# Patient Record
Sex: Male | Born: 1995
Health system: Southern US, Community
[De-identification: ages and names within clinical notes are randomized; demographics above are authoritative.]

## PROBLEM LIST (undated history)

## (undated) DIAGNOSIS — Z789 Other specified health status: Secondary | ICD-10-CM

## (undated) DIAGNOSIS — F909 Attention-deficit hyperactivity disorder, unspecified type: Secondary | ICD-10-CM

## (undated) HISTORY — DX: Attention-deficit hyperactivity disorder, unspecified type: F90.9

## (undated) HISTORY — PX: NO PAST SURGERIES: SHX2092

---
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
## 2015-03-13 ENCOUNTER — Encounter: Payer: Self-pay | Admitting: Emergency Medicine

## 2015-03-13 ENCOUNTER — Ambulatory Visit
Admission: EM | Admit: 2015-03-13 | Discharge: 2015-03-13 | Disposition: A | Discharge status: Home / Self Care | Payer: 59 | Attending: Family Medicine | Admitting: Family Medicine

## 2015-03-13 DIAGNOSIS — J101 Influenza due to other identified influenza virus with other respiratory manifestations: Secondary | ICD-10-CM | POA: Diagnosis not present

## 2015-03-13 LAB — RAPID INFLUENZA A&B ANTIGENS
Influenza A (ARMC): DETECTED — AB
Influenza B (ARMC): NOT DETECTED — AB

## 2015-03-13 MED ORDER — OSELTAMIVIR PHOSPHATE 75 MG PO CAPS: 75.0000 mg | ORAL_CAPSULE | Freq: Two times a day (BID) | ORAL | Status: DC

## 2015-03-13 MED ORDER — IBUPROFEN 800 MG PO TABS
800.0000 mg | ORAL_TABLET | Freq: Once | ORAL | Status: AC
  Administered 2015-03-13: 800 mg via ORAL

## 2015-03-13 NOTE — ED Notes (Signed)
Pt reports fever since Sat, flu like symptoms.

## 2015-03-13 NOTE — Discharge Instructions (Signed)
Take medication as prescribed. Rest. Drink plenty of fluids. Take over the counter tylenol or ibuprofen as needed for pain or fever.   Follow up with your primary care physician this week as needed. Return to Urgent care for new or worsening concerns.    Influenza, Adult Influenza ("the flu") is a viral infection of the respiratory tract. It occurs more often in winter months because people spend more time in close contact with one another. Influenza can make you feel very sick. Influenza easily spreads from person to person (contagious). CAUSES  Influenza is caused by a virus that infects the respiratory tract. You can catch the virus by breathing in droplets from an infected person's cough or sneeze. You can also catch the virus by touching something that was recently contaminated with the virus and then touching your mouth, nose, or eyes. RISKS AND COMPLICATIONS You may be at risk for a more severe case of influenza if you smoke cigarettes, have diabetes, have chronic heart disease (such as heart failure) or lung disease (such as asthma), or if you have a weakened immune system. Elderly people and pregnant women are also at risk for more serious infections. The most common problem of influenza is a lung infection (pneumonia). Sometimes, this problem can require emergency medical care and may be life threatening. SIGNS AND SYMPTOMS  Symptoms typically last 4 to 10 days and may include:  Fever.  Chills.  Headache, body aches, and muscle aches.  Sore throat.  Chest discomfort and cough.  Poor appetite.  Weakness or feeling tired.  Dizziness.  Nausea or vomiting. DIAGNOSIS  Diagnosis of influenza is often made based on your history and a physical exam. A nose or throat swab test can be done to confirm the diagnosis. TREATMENT  In mild cases, influenza goes away on its own. Treatment is directed at relieving symptoms. For more severe cases, your health care provider may prescribe  antiviral medicines to shorten the sickness. Antibiotic medicines are not effective because the infection is caused by a virus, not by bacteria. HOME CARE INSTRUCTIONS  Take medicines only as directed by your health care provider.  Use a cool mist humidifier to make breathing easier.  Get plenty of rest until your temperature returns to normal. This usually takes 3 to 4 days.  Drink enough fluid to keep your urine clear or pale yellow.  Cover yourmouth and nosewhen coughing or sneezing,and wash your handswellto prevent thevirusfrom spreading.  Stay homefromwork orschool untilthe fever is gonefor at least 80full day. PREVENTION  An annual influenza vaccination (flu shot) is the best way to avoid getting influenza. An annual flu shot is now routinely recommended for all adults in the [place] SEEK MEDICAL CARE IF:  You experiencechest pain, yourcough worsens,or you producemore mucus.  Youhave nausea,vomiting, ordiarrhea.  Your fever returns or gets worse. SEEK IMMEDIATE MEDICAL CARE IF:  You havetrouble breathing, you become short of breath,or your skin ornails becomebluish.  You have severe painor stiffnessin the neck.  You develop a sudden headache, or pain in the face or ear.  You have nausea or vomiting that you cannot control. MAKE SURE YOU:   Understand these instructions.  Will watch your condition.  Will get help right away if you are not doing well or get worse.   This information is not intended to replace advice given to you by your health care provider. Make sure you discuss any questions you have with your health care provider.   Document Released: 12/28/1999 Document  Revised: 01/20/2014 Document Reviewed: 03/31/2011 Elsevier Interactive Patient Education [employer]

## 2015-03-13 NOTE — ED Provider Notes (Signed)
[facility]  ____________________________________________  Time seen: Approximately 11:48 AM  I have reviewed the triage vital signs and the nursing notes.   HISTORY  Chief Complaint Fever   HPI Adam Cantu is a 20 y.o. male presents for complaints of runny nose, nasal congestion, cough, feeling like he has a fever, body aches x 2 days. Reports continues to drink fluids well but with slight decrease in appetite. States generally achy all over. States has taken over-the-counter Tylenol and ibuprofen. Last took Tylenol early this morning. Denies home sick contacts. Reports some sick contacts at work but unsure of what they had. Reports did not receive flu immunization.  Denies chest pain, shortness breath, abdominal pain, dysuria, neck or back pain, rash or weakness.   History reviewed. No pertinent past medical history.  There are no active problems to display for this patient.   History reviewed. No pertinent past surgical history.  Current Outpatient Rx  Name  Route  Sig  Dispense  Refill  .             Allergies Review of patient's allergies indicates no known allergies.  History reviewed. No pertinent family history.  Social History Social History  Substance Use Topics  . Smoking status: Current Some Day Smoker  . Smokeless tobacco: None  . Alcohol Use: No    Review of Systems Constitutional: positive warm/chills Eyes: No visual changes. ENT: No sore throat. Positive runny nose, nasal congestion, cough. Cardiovascular: Denies chest pain. Respiratory: Denies shortness of breath. Gastrointestinal: No abdominal pain.  No nausea, no vomiting.  No diarrhea.  No constipation. Genitourinary: Negative for dysuria. Musculoskeletal: Negative for back pain. Skin: Negative for rash. Neurological: Negative for headaches, focal weakness or numbness.  10-point ROS otherwise negative. ____________________________________________   PHYSICAL EXAM:  VITAL  SIGNS: ED Triage Vitals  Enc Vitals Group     BP 03/13/15 1019 129/78 mmHg     Pulse Rate 03/13/15 1019 90     Resp 03/13/15 1019 18     Temp 03/13/15 1019 100 F (37.8 C)     Temp Source 03/13/15 1019 Oral     SpO2 03/13/15 1019 100 %     Weight 03/13/15 1019 234 lb (106.142 kg)     Height 03/13/15 1019  (1.854 m)     Head Cir --      Peak Flow --      Pain Score 03/13/15 1022 7     Pain Loc --      Pain Edu? --      Excl. in GC? --   Constitutional: Alert and oriented. Well appearing and in no acute distress. Eyes: Conjunctivae are normal. PERRL. EOMI. Head: Atraumatic. No sinus tenderness to palpation. No swelling. No erythema.  Ears: no erythema, normal TMs bilaterally.   Nose:Nasal congestion with clear rhinorrhea  Mouth/Throat: Mucous membranes are moist. No pharyngeal erythema. No tonsillar swelling or exudate.  Neck: No stridor.  No cervical spine tenderness to palpation. Hematological/Lymphatic/Immunilogical: No cervical lymphadenopathy. Cardiovascular: Normal rate, regular rhythm. Grossly normal heart sounds.  Good peripheral circulation. Respiratory: Normal respiratory effort.  No retractions. Lungs CTAB.No wheezes, rales or rhonchi. Good air movement.  Gastrointestinal: Soft and nontender. Normal Bowel sounds. No CVA tenderness. Musculoskeletal: No lower or upper extremity tenderness nor edema. No cervical, thoracic or lumbar tenderness to palpation. Neurologic:  Normal speech and language. No gross focal neurologic deficits are appreciated. No gait instability. Skin:  Skin is warm, dry and intact. No rash  noted. Psychiatric: Mood and affect are normal. Speech and behavior are normal.  ____________________________________________   LABS (all labs ordered are listed, but only abnormal results are displayed)  Labs Reviewed  RAPID INFLUENZA A&B ANTIGENS ([facility] ONLY) - Abnormal; Notable for the following:    Influenza A ([facility]) DETECTED (*)    Influenza B ([facility])  NOT DETECTED (*)    All other components within normal limits     INITIAL IMPRESSION / ASSESSMENT AND PLAN / ED COURSE  Pertinent labs & imaging results that were available during my care of the patient were reviewed by me and considered in my medical decision making (see chart for details).  Very well-appearing patient. No acute distress. Presents with complaints of 2 days of runny nose, nasal congestion, cough, body aches and subjective report of fevers. Reports has taken over-the-counter cough and congestion medicines as well as Tylenol and ibuprofen. Lungs clear throughout. Abdomen soft and nontender. Very well-appearing patient. Moist mucous membranes. Suspect viral upper respiratory infection such as influenza.ibuprofen 800 mg orally x one in urgent care.   Influenza A positive. Discussed with patient treatment with Tamiflu. Patient requests treatment with Tamiflu. Encourage rest, fluids, over-the-counter Tylenol or ibuprofen as needed. Patient denies need for additional medication. Discussed indication, risks and benefits of medications with patient. Follow up with PCP. Work note for today and tomorrow given.  Discussed follow up with Primary care physician this week. Discussed follow up and return parameters including no resolution or any worsening concerns. Patient verbalized understanding and agreed to plan.   ____________________________________________   FINAL CLINICAL IMPRESSION(S) / ED DIAGNOSES  Final diagnoses:  Influenza A      Note: This dictation was prepared with Dragon dictation along with smaller phrase technology. Any transcriptional errors that result from this process are unintentional.    Renford Dills, NP 03/13/15 1200  Renford Dills, NP 03/13/15 [phone]

## 2015-05-10 ENCOUNTER — Ambulatory Visit
Admission: EM | Admit: 2015-05-10 | Discharge: 2015-05-10 | Disposition: A | Discharge status: Home / Self Care | Payer: 59 | Attending: Family Medicine | Admitting: Family Medicine

## 2015-05-10 ENCOUNTER — Encounter: Payer: Self-pay | Admitting: Emergency Medicine

## 2015-05-10 DIAGNOSIS — S39012A Strain of muscle, fascia and tendon of lower back, initial encounter: Secondary | ICD-10-CM | POA: Diagnosis not present

## 2015-05-10 MED ORDER — NAPROXEN 500 MG PO TABS: 500.0000 mg | ORAL_TABLET | Freq: Two times a day (BID) | ORAL | Status: DC

## 2015-05-10 MED ORDER — METAXALONE 800 MG PO TABS
800.0000 mg | ORAL_TABLET | Freq: Three times a day (TID) | ORAL | Status: DC
Start: 2015-05-10 — End: 2016-02-19

## 2015-05-10 NOTE — Discharge Instructions (Signed)
Back Injury Prevention °Back injuries can be very painful. They can also be difficult to heal. After having one back injury, you are more likely to injure your back again. It is important to learn how to avoid injuring or re-injuring your back. The following tips can help you to prevent a back injury. °WHAT SHOULD I KNOW ABOUT PHYSICAL FITNESS? °· Exercise for 30 minutes per day on most days of the week or as directed by your health care provider. Make sure to: °· Do aerobic exercises, such as walking, jogging, biking, or swimming. °· Do exercises that increase balance and strength, such as tai chi and yoga. These can decrease your risk of falling and injuring your back. °· Do stretching exercises to help with flexibility. °· Try to develop strong abdominal muscles. Your abdominal muscles provide a lot of the support that is needed by your back. °· Maintain a healthy weight.  This helps to decrease your risk of a back injury. °WHAT SHOULD I KNOW ABOUT MY DIET? °· Talk with your health care provider about your overall diet. Take supplements and vitamins only as directed by your health care provider. °· Talk with your health care provider about how much calcium and vitamin D you need each day. These nutrients help to prevent weakening of the bones (osteoporosis). Osteoporosis can cause broken (fractured) bones, which lead to back pain. °· Include good sources of calcium in your diet, such as dairy products, green leafy vegetables, and products that have had calcium added to them (fortified). °· Include good sources of vitamin D in your diet, such as milk and foods that are fortified with vitamin D. °WHAT SHOULD I KNOW ABOUT MY POSTURE? °· Sit up straight and stand up straight. Avoid leaning forward when you sit or hunching over when you stand. °· Choose chairs that have good low-back (lumbar) support. °· If you work at a desk, sit close to it so you do not need to lean over. Keep your chin tucked in. Keep your neck  drawn back, and keep your elbows bent at a right angle. Your arms should look like the letter "L." °· Sit high and close to the steering wheel when you drive. Add a lumbar support to your car seat, if needed. °· Avoid sitting or standing in one position for very long. Take breaks to get up, stretch, and walk around at least one time every hour. Take breaks every hour if you are driving for long periods of time. °· Sleep on your side with your knees slightly bent, or sleep on your back with a pillow under your knees. Do not lie on the front of your body to sleep. °WHAT SHOULD I KNOW ABOUT LIFTING, TWISTING, AND REACHING? °Lifting and Heavy Lifting °· Avoid heavy lifting, especially repetitive heavy lifting. If you must do heavy lifting: °· Stretch before lifting. °· Work slowly. °· Rest between lifts. °· Use a tool such as a cart or a dolly to move objects if one is available. °· Make several small trips instead of carrying one heavy load. °· Ask for help when you need it, especially when moving big objects. °· Follow these steps when lifting: °· Stand with your feet shoulder-width apart. °· Get as close to the object as you can. Do not try to pick up a heavy object that is far from your body. °· Use handles or lifting straps if they are available. °· Bend at your knees. Squat down, but keep your heels off the floor. °·   Keep your shoulders pulled back, your chin tucked in, and your back straight. °· Lift the object slowly while you tighten the muscles in your legs, abdomen, and buttocks. Keep the object as close to the center of your body as possible. °· Follow these steps when putting down a heavy load: °· Stand with your feet shoulder-width apart. °· Lower the object slowly while you tighten the muscles in your legs, abdomen, and buttocks. Keep the object as close to the center of your body as possible. °· Keep your shoulders pulled back, your chin tucked in, and your back straight. °· Bend at your knees. Squat  down, but keep your heels off the floor. °· Use handles or lifting straps if they are available. °Twisting and Reaching °· Avoid lifting heavy objects above your waist. °· Do not twist at your waist while you are lifting or carrying a load. If you need to turn, move your feet. °· Do not bend over without bending at your knees. °· Avoid reaching over your head, across a table, or for an object on a high surface. °WHAT ARE SOME OTHER TIPS? °· Avoid wet floors and icy ground. Keep sidewalks clear of ice to prevent falls. °· Do not sleep on a mattress that is too soft or too hard. °· Keep items that are used frequently within easy reach. °· Put heavier objects on shelves at waist level, and put lighter objects on lower or higher shelves. °· Find ways to decrease your stress, such as exercise, massage, or relaxation techniques. Stress can build up in your muscles. Tense muscles are more vulnerable to injury. °· Talk with your health care provider if you feel anxious or depressed. These conditions can make back pain worse. °· Wear flat heel shoes with cushioned soles. °· Avoid sudden movements. °· Use both shoulder straps when carrying a backpack. °· Do not use any tobacco products, including cigarettes, chewing tobacco, or electronic cigarettes. If you need help quitting, ask your health care provider. °  °This information is not intended to replace advice given to you by your health care provider. Make sure you discuss any questions you have with your health care provider. °  °Document Released: 02/07/2004 Document Revised: 05/16/2014 Document Reviewed: 01/03/2014 °Elsevier Interactive Patient Education ©2016 [employer] ° °Lumbosacral Strain °Lumbosacral strain is a strain of any of the parts that make up your lumbosacral vertebrae. Your lumbosacral vertebrae are the bones that make up the lower third of your backbone. Your lumbosacral vertebrae are held together by muscles and tough, fibrous tissue (ligaments).    °CAUSES  °A sudden blow to your back can cause lumbosacral strain. Also, anything that causes an excessive stretch of the muscles in the low back can cause this strain. This is typically seen when people exert themselves strenuously, fall, lift heavy objects, bend, or crouch repeatedly. °RISK FACTORS °· Physically demanding work. °· Participation in pushing or pulling sports or sports that require a sudden twist of the back (tennis, golf, baseball). °· Weight lifting. °· Excessive lower back curvature. °· Forward-tilted pelvis. °· Weak back or abdominal muscles or both. °· Tight hamstrings. °SIGNS AND SYMPTOMS  °Lumbosacral strain may cause pain in the area of your injury or pain that moves (radiates) down your leg.  °DIAGNOSIS °Your health care provider can often diagnose lumbosacral strain through a physical exam. In some cases, you may need tests such as X-ray exams.  °TREATMENT  °Treatment for your lower back injury depends on many factors that   your clinician will have to evaluate. However, most treatment will include the use of anti-inflammatory medicines. °HOME CARE INSTRUCTIONS  °· Avoid hard physical activities (tennis, racquetball, waterskiing) if you are not in proper physical condition for it. This may aggravate or create problems. °· If you have a back problem, avoid sports requiring sudden body movements. Swimming and walking are generally safer activities. °· Maintain good posture. °· Maintain a healthy weight. °· For acute conditions, you may put ice on the injured area. °¨ Put ice in a plastic bag. °¨ Place a towel between your skin and the bag. °¨ Leave the ice on for 20 minutes, 2-3 times a day. °· When the low back starts healing, stretching and strengthening exercises may be recommended. °SEEK MEDICAL CARE IF: °· Your back pain is getting worse. °· You experience severe back pain not relieved with medicines. °SEEK IMMEDIATE MEDICAL CARE IF:  °· You have numbness, tingling, weakness, or problems  with the use of your arms or legs. °· There is a change in bowel or bladder control. °· You have increasing pain in any area of the body, including your belly (abdomen). °· You notice shortness of breath, dizziness, or feel faint. °· You feel sick to your stomach (nauseous), are throwing up (vomiting), or become sweaty. °· You notice discoloration of your toes or legs, or your feet get very cold. °MAKE SURE YOU:  °· Understand these instructions. °· Will watch your condition. °· Will get help right away if you are not doing well or get worse. °  °This information is not intended to replace advice given to you by your health care provider. Make sure you discuss any questions you have with your health care provider. °  °Document Released: 10/09/2004 Document Revised: 01/20/2014 Document Reviewed: 08/18/2012 °Elsevier Interactive Patient Education ©2016 [employer] Inc[employer] ° °

## 2015-05-10 NOTE — ED Provider Notes (Signed)
CSN: [id number]     Arrival date & time 05/10/15  0818 History   First MD Initiated Contact with Patient 05/10/15 [phone]     Chief Complaint  Patient presents with  . Back Pain   (Consider location/radiation/quality/duration/timing/severity/associated sxs/prior Treatment) HPI  This is a 20 year old male who presents with a 2 day history of nonradiating lower back pain. Patient states that he had never hurt his back before. He and another individual were lifting the back of the a S 10 pickup truck in order to change a tire injuring his low back. He states that it will bother him from about L1 down through the sacrum. It is worse with bending. He denies any numbness in his legs or feet. He denies any incontinence.    History reviewed. No pertinent past medical history. History reviewed. No pertinent past surgical history. History reviewed. No pertinent family history. Social History  Substance Use Topics  . Smoking status: Current Some Day Smoker  . Smokeless tobacco: None  . Alcohol Use: No    Review of Systems  Constitutional: Positive for activity change. Negative for fever, chills and fatigue.  Musculoskeletal: Positive for myalgias and back pain.  All other systems reviewed and are negative.   Allergies  Review of patient's allergies indicates no known allergies.  Home Medications   Prior to Admission medications   Medication Sig Start Date End Date Taking? Authorizing Provider  metaxalone (SKELAXIN) 800 MG tablet Take 1 tablet (800 mg total) by mouth 3 (three) times daily. 05/10/15   Lutricia FeilWilliam P Burgundy Matuszak, PA-C  naproxen (NAPROSYN) 500 MG tablet Take 1 tablet (500 mg total) by mouth 2 (two) times daily with a meal. 05/10/15   Lutricia FeilWilliam P Leba Tibbitts, PA-C  oseltamivir (TAMIFLU) 75 MG capsule Take 1 capsule (75 mg total) by mouth every 12 (twelve) hours. 03/13/15   Renford DillsLindsey Miller, NP   Meds Ordered and Administered this Visit  Medications - No data to display  BP 140/85 mmHg  Pulse 76   Temp(Src) 97.4 F (36.3 C) (Tympanic)  Resp 16  Ht 6' (1.829 m)  Wt 234 lb (106.142 kg)  BMI 31.73 kg/m2  SpO2 100% No data found.   Physical Exam  Constitutional: He is oriented to person, place, and time. He appears well-developed and well-nourished. No distress.  HENT:  Head: Normocephalic and atraumatic.  Eyes: Conjunctivae are normal. Pupils are equal, round, and reactive to light.  Neck: Normal range of motion. Neck supple.  Musculoskeletal: Normal range of motion. He exhibits tenderness.  Examination of the lumbar spine shows a level pelvis in stance. Patient does have blunting of the lower segments particularly with forward flexion and lateral flexion bilaterally. These motions do cause him discomfort. He has difficulty in assuming an erect stance from forward flexion. He is able to toe and heel walk adequately. EHL anterior tibialis and peroneal muscles are strong. DTRs are 2+ over 4 and bilaterally symmetrical. Straight leg raise testing is negative at 90 in the sitting position. Sensation is intact to light touch throughout.  Neurological: He is alert and oriented to person, place, and time.  Skin: Skin is warm and dry. He is not diaphoretic.  Psychiatric: He has a normal mood and affect. His behavior is normal. Judgment and thought content normal.  Nursing note and vitals reviewed.   ED Course  Procedures (including critical care time)  Labs Review Labs Reviewed - No data to display  Imaging Review No results found.   Visual Acuity Review  Right Eye Distance:   Left Eye Distance:   Bilateral Distance:    Right Eye Near:   Left Eye Near:    Bilateral Near:         MDM   1. Lumbar strain, initial encounter    Discharge Medication List as of 05/10/2015  9:01 AM    START taking these medications   Details  metaxalone (SKELAXIN) 800 MG tablet Take 1 tablet (800 mg total) by mouth 3 (three) times daily., Starting 05/10/2015, Until Discontinued, Normal     naproxen (NAPROSYN) 500 MG tablet Take 1 tablet (500 mg total) by mouth 2 (two) times daily with a meal., Starting 05/10/2015, Until Discontinued, Normal      Plan: 1. Test/x-ray results and diagnosis reviewed with patient 2. rx as per orders; risks, benefits, potential side effects reviewed with patient 3. Recommend supportive treatment with Heat or ice as necessary. I told him that he must output symptoms in order for his back to heal properly. If he has any increase in symptoms radicular symptoms or any incontinence he needs to be seen immediately in the emergency department. I have advised him against driving or performing activities requiring judgment her concentration while taking Skelaxin. We will follow him on a when necessary basis. He states that he is currently off work at [employer] while they change the [employer] but will return to work tomorrow. 4. F/u prn if symptoms worsen or don't improve     Lutricia Feil, PA-C 05/10/15 [phone]

## 2015-05-10 NOTE — ED Notes (Signed)
Patient c/o lower back pain that started 2 days ago.

## 2015-05-12 ENCOUNTER — Ambulatory Visit
Admission: EM | Admit: 2015-05-12 | Discharge: 2015-05-12 | Disposition: A | Discharge status: Home / Self Care | Payer: 59 | Attending: Family Medicine | Admitting: Family Medicine

## 2015-05-12 DIAGNOSIS — J029 Acute pharyngitis, unspecified: Secondary | ICD-10-CM | POA: Diagnosis not present

## 2015-05-12 DIAGNOSIS — R591 Generalized enlarged lymph nodes: Secondary | ICD-10-CM

## 2015-05-12 DIAGNOSIS — B2790 Infectious mononucleosis, unspecified without complication: Secondary | ICD-10-CM | POA: Diagnosis not present

## 2015-05-12 HISTORY — DX: Other specified health status: Z78.9

## 2015-05-12 LAB — CBC WITH DIFFERENTIAL/PLATELET
BASOS ABS: 0 10*3/uL (ref 0–0.1)
Basophils Relative: 0 %
EOS ABS: 0.2 10*3/uL (ref 0–0.7)
Eosinophils Relative: 2 %
HCT: 40.6 % (ref 40.0–52.0)
HEMOGLOBIN: 13.5 g/dL (ref 13.0–18.0)
LYMPHS ABS: 6.6 10*3/uL — AB (ref 1.0–3.6)
Lymphocytes Relative: 63 %
MCH: 28.4 pg (ref 26.0–34.0)
MCHC: 33.3 g/dL (ref 32.0–36.0)
MCV: 85.2 fL (ref 80.0–100.0)
MONO ABS: 0.6 10*3/uL (ref 0.2–1.0)
MONOS PCT: 6 %
NEUTROS ABS: 3 10*3/uL (ref 1.4–6.5)
Neutrophils Relative %: 29 %
Platelets: 235 10*3/uL (ref 150–440)
RBC: 4.76 MIL/uL (ref 4.40–5.90)
RDW: 13.2 % (ref 11.5–14.5)
WBC: 10.4 10*3/uL (ref 3.8–10.6)

## 2015-05-12 LAB — RAPID STREP SCREEN (MED CTR MEBANE ONLY): STREPTOCOCCUS, GROUP A SCREEN (DIRECT): NEGATIVE

## 2015-05-12 LAB — MONONUCLEOSIS SCREEN: MONO SCREEN: POSITIVE — AB

## 2015-05-12 MED ORDER — AZITHROMYCIN 250 MG PO TABS: ORAL_TABLET | ORAL | Status: DC

## 2015-05-12 MED ORDER — LIDOCAINE VISCOUS 2 % MT SOLN: 15.0000 mL | Freq: Three times a day (TID) | OROMUCOSAL | Status: DC | PRN

## 2015-05-12 MED ORDER — DEXAMETHASONE SODIUM PHOSPHATE 10 MG/ML IJ SOLN
10.0000 mg | Freq: Once | INTRAMUSCULAR | Status: AC
  Administered 2015-05-12: 10 mg via INTRAMUSCULAR

## 2015-05-12 MED ORDER — CEFTRIAXONE SODIUM 1 G IJ SOLR
1.0000 g | Freq: Once | INTRAMUSCULAR | Status: AC
  Administered 2015-05-12: 1 g via INTRAMUSCULAR

## 2015-05-12 NOTE — ED Provider Notes (Signed)
[facility]  ____________________________________________  Time seen: Approximately 1:06 PM  I have reviewed the triage vital signs and the nursing notes.   HISTORY  Chief Complaint Lymphadenopathy   HPI Adam Cantu is a 20 y.o. male presents with a complaint of sore throat 2 weeks. Patient states that initially both sides of his throat hurt but states over the last week only the right side of his throat has hurt. Patient reports that he is able to continue to eat and drink but it hurts to swallow. Reports continues to drink fluids pretty well. Patient reports that he feels like his lymph nodes on his right neck are swollen area to denies known fevers. Denies runny nose, cough, congestion, abdominal pain, dysuria, chest pain, shortness of breath, rash, neck pain, back pain, extremity pain. Reports some sick coworkers. States coworker had strep just prior to his onset.  PCP: Loney Hering  Past Medical History  Diagnosis Date  . Patient denies medical problems     There are no active problems to display for this patient.   History reviewed. No pertinent past surgical history.  Current Outpatient Rx  Name  Route  Sig  Dispense  Refill  .           .           .             Allergies Review of patient's allergies indicates no known allergies.  History reviewed. No pertinent family history.  Social History Social History  Substance Use Topics  . Smoking status: Current Some Day Smoker -- 0.50 packs/day    Types: Cigarettes  . Smokeless tobacco: None  . Alcohol Use: No    Review of Systems Constitutional: No fever/chills Eyes: No visual changes. ENT: Positive sore throat.  Cardiovascular: Denies chest pain. Respiratory: Denies shortness of breath. Gastrointestinal: No abdominal pain.  No nausea, no vomiting.  No diarrhea.  No constipation. Genitourinary: Negative for dysuria. Musculoskeletal: Negative for back pain. Skin: Negative for rash. Neurological:  Negative for headaches, focal weakness or numbness.  10-point ROS otherwise negative.  ____________________________________________   PHYSICAL EXAM:  VITAL SIGNS: ED Triage Vitals  Enc Vitals Group     BP 05/12/15 1225 134/81 mmHg     Pulse Rate 05/12/15 1225 85     Resp 05/12/15 1225 20     Temp 05/12/15 1225 98.2 F (36.8 C)     Temp Source 05/12/15 1225 Oral     SpO2 05/12/15 1225 100 %     Weight 05/12/15 1225 234 lb (106.142 kg)     Height 05/12/15 1225 6' (1.829 m)     Head Cir --      Peak Flow --      Pain Score 05/12/15 1228 9     Pain Loc --      Pain Edu? --      Excl. in GC? --     Constitutional: Alert and oriented. Well appearing and in no acute distress. Eyes: Conjunctivae are normal. PERRL. EOMI. Head: Atraumatic. No sinus tenderness to palpation. No swelling, no erythema.   Ears: no erythema, normal TMs bilaterally.   Nose: No congestion/rhinnorhea.  Mouth/Throat: Mucous membranes are moist.  Moderate pharyngeal erythema bilaterally. Mild left tonsillar swelling, 2+ right tonsillar swelling with exudative appearance. Mild uvular shift to the right. Neck: No stridor.  No cervical spine tenderness to palpation. Hematological/Lymphatic/Immunilogical: anterior right cervical lymphadenopathy, x one right post auricular lymphadenopathy  Cardiovascular: Normal rate,  regular rhythm. Grossly normal heart sounds.  Good peripheral circulation. Respiratory: Normal respiratory effort.  No retractions. Lungs CTAB. no wheezes, rales or rhonchi. Gastrointestinal: Soft and nontender. No distention. Normal Bowel sounds.  No hepatomegaly or splenomegaly palpated. Musculoskeletal: No lower or upper extremity tenderness nor edema.  Neurologic:  Normal speech and language. No gross focal neurologic deficits are appreciated. No gait instability. Skin:  Skin is warm, dry and intact. No rash noted. Psychiatric: Mood and affect are normal. Speech and behavior are  normal.  ____________________________________________   LABS (all labs ordered are listed, but only abnormal results are displayed)  Labs Reviewed  CBC WITH DIFFERENTIAL/PLATELET - Abnormal; Notable for the following:    Lymphs Abs 6.6 (*)    All other components within normal limits  MONONUCLEOSIS SCREEN - Abnormal; Notable for the following:    Mono Screen POSITIVE (*)    All other components within normal limits  RAPID STREP SCREEN (NOT AT [facility])  CULTURE, GROUP A STREP [facility])     INITIAL IMPRESSION / ASSESSMENT AND PLAN / ED COURSE  Pertinent labs & imaging results that were available during my care of the patient were reviewed by me and considered in my medical decision making (see chart for details). Well-appearing patient. No acute distress. Presents for the complaints of 2 weeks of sore throat which gradually progressed to only right-sided sore throat. Reports able to continue to eat and drink well. Denies fevers. Vital signs stable. No medications taken today. Patient with right-sided tonsillar swelling with appearance concerning for right peritonsillar abscess. Will discuss with ENT. Will evaluate strep, mono, CBC.  Strep negative, will culture. Mono positive. CBC reviewed.  1340: Called and discussed patient with ENT Dr. Willeen CassBennett, as concern for peritonsillar abscess. Discussed with Dr. Willeen CassBennett as patient is currently tolerating oral fluids, afebrile and vital signs stable will treat patient outpatient and have patient call first thing Monday morning to schedule follow-up with ENT. Patient verbalized understanding of this. Per Dr Willeen CassBennett recommendations, will give 10 mg IM Decadron and 1 g IM Rocephin in urgent care. Mono positive. Per Dr Wardell HeathBennet to have patient call office on Monday morning to schedule appointment for Monday.   Concern for possible infection of strep and mono. Will culture strep swab. Will treat with oral azithromycin, rather than PCN. Encouraged rest, fluids  and close PCP and ENT follow-up. Discussed in detail with patient if symptoms do not improve or worsen to proceed directly to the emergency room.  Discussed follow up with Primary care physician this week. Discussed follow up and return parameters including no resolution or any worsening concerns. Patient verbalized understanding and agreed to plan.   ____________________________________________   FINAL CLINICAL IMPRESSION(S) / ED DIAGNOSES  Final diagnoses:  Mononucleosis  Pharyngitis  Lymphadenopathy      Note: This dictation was prepared with Dragon dictation along with smaller phrase technology. Any transcriptional errors that result from this process are unintentional.    Renford DillsLindsey Prisca Gearing, NP 05/12/15 1651

## 2015-05-12 NOTE — Discharge Instructions (Signed)
Take medication as prescribed. Rest. Drink plenty of fluids. Take over the counter tylenol or ibuprofen as needed.   Follow up with Ear, Nose and Throat (ENT) on Monday. Call first thing on Monday to schedule appointment.  Follow up with your primary care physician this week. Return to Urgent care or proceed to ER for difficulty swallowing or eating, fever, new or worsening concerns.    Infectious Mononucleosis Infectious mononucleosis is an infection caused by a virus. This illness is often called "mono." It causes symptoms that affect various areas of the body, including the throat, upper air passages, and lymph glands. The liver or spleen may also be affected. The virus spreads from person to person through close contact. The illness is usually not serious and often goes away in 2-4 weeks without treatment. In rare cases, symptoms can be more severe and last longer, sometimes up to several months. Because the illness can sometimes cause the liver or spleen to become enlarged, you should not participate in contact sports or strenuous exercise until your health care provider approves. CAUSES  Infectious mononucleosis is caused by the Epstein-Barr virus. This virus spreads through contact with an infected person's saliva or other bodily fluids. It is often spread through kissing. It may also spread through coughing or sharing utensils or drinking glasses that were recently used by an infected person. An infected person will not always appear ill but can still spread the virus. RISK FACTORS This illness is most common in adolescents and young adults. SIGNS AND SYMPTOMS  The most common symptoms of infectious mononucleosis are:  Sore throat.   Headache.   Fatigue.   Muscle aches.   Swollen glands.   Fever.   Poor appetite.   Enlarged liver or spleen.  Some less common symptoms that can also occur include:  Rash. This is more common if you take antibiotic medicines.  Feeling  sick to your stomach (nauseous).   Abdominal pain.  DIAGNOSIS  Your health care provider will take your medical history and do a physical exam. Blood tests can be done to confirm the diagnosis.  TREATMENT  Infectious mononucleosis usually goes away on its own with time. It cannot be cured with medicines, but medicines are sometimes used to relieve symptoms. Steroid medicine is sometimes needed if the swelling in the throat causes breathing or swallowing problems. Treatment in a hospital is sometimes needed for severe cases.  HOME CARE INSTRUCTIONS   Rest as needed.   Do not participate in contact sports, strenuous exercise, or heavy lifting until your health care provider approves. The liver and spleen could be seriously injured if they are enlarged from the illness. You may need to wait a couple months before participating in sports.   Drink enough fluid to keep your urine clear or pale yellow.   Do not drink alcohol.  Take medicines only as directed by your health care provider. Children under 37 years of age should not take aspirin because of the association with Reye syndrome.   Eat soft foods. Cold foods such as ice cream or frozen ice pops can soothe a sore throat.  If you have a sore throat, gargle with a mixture of salt and water. This may help relieve your discomfort. Mix 1 tsp of salt in 1 cup of warm water. Sucking on hard candy may also help.   Start regular activities gradually after the fever is gone. Be sure to rest when tired.   Avoid kissing or sharing utensils or drinking  glasses until your health care provider tells you that you are no longer contagious.  PREVENTION  To avoid spreading the virus, do not kiss anyone or share utensils, drinking glasses, or food until your health care provider tells you that you are no longer contagious. SEEK MEDICAL CARE IF:   Your fever is not gone after 10 days.  You have swollen lymph nodes that are not back to normal  after 4 weeks.  Your activity level is not back to normal after 2 months.   You have yellow coloring to your eyes and skin (jaundice).  You have constipation.  SEEK IMMEDIATE MEDICAL CARE IF:   You have severe pain in the abdomen or shoulder.  You are drooling.  You have trouble swallowing.  You have trouble breathing.  You develop a stiff neck.  You develop a severe headache.  You cannot stop throwing up (vomiting).  You have convulsions.  You are confused.  You have trouble with balance.  You have signs of dehydration. These may include:  Weakness.  Sunken eyes.  Pale skin.  Dry mouth.  Rapid breathing or pulse.   This information is not intended to replace advice given to you by your health care provider. Make sure you discuss any questions you have with your health care provider.   Document Released: 12/28/1999 Document Revised: 01/20/2014 Document Reviewed: 09/06/2013 Elsevier Interactive Patient Education 2016 [employer]  Lymphadenopathy Lymphadenopathy refers to swollen or enlarged lymph glands, also called lymph nodes. Lymph glands are part of your body's defense (immune) system, which protects the body from infections, germs, and diseases. Lymph glands are found in many locations in your body, including the neck, underarm, and groin.  Many things can cause lymph glands to become enlarged. When your immune system responds to germs, such as viruses or bacteria, infection-fighting cells and fluid build up. This causes the glands to grow in size. Usually, this is not something to worry about. The swelling and any soreness often go away without treatment. However, swollen lymph glands can also be caused by a number of diseases. Your health care provider may do various tests to help determine the cause. If the cause of your swollen lymph glands cannot be found, it is important to monitor your condition to make sure the swelling goes away. HOME CARE  INSTRUCTIONS Watch your condition for any changes. The following actions may help to lessen any discomfort you are feeling:  Get plenty of rest.  Take medicines only as directed by your health care provider. Your health care provider may recommend over-the-counter medicines for pain.  Apply moist heat compresses to the site of swollen lymph nodes as directed by your health care provider. This can help reduce any pain.  Check your lymph nodes daily for any changes.  Keep all follow-up visits as directed by your health care provider. This is important. SEEK MEDICAL CARE IF:  Your lymph nodes are still swollen after 2 weeks.  Your swelling increases or spreads to other areas.  Your lymph nodes are hard, seem fixed to the skin, or are growing rapidly.  Your skin over the lymph nodes is red and inflamed.  You have a fever.  You have chills.  You have fatigue.  You develop a sore throat.  You have abdominal pain.  You have weight loss.  You have night sweats. SEEK IMMEDIATE MEDICAL CARE IF:  You notice fluid leaking from the area of the enlarged lymph node.  You have severe pain in  any area of your body.  You have chest pain.  You have shortness of breath.   This information is not intended to replace advice given to you by your health care provider. Make sure you discuss any questions you have with your health care provider.   Document Released: 10/09/2007 Document Revised: 01/20/2014 Document Reviewed: 08/04/2013 Elsevier Interactive Patient Education 2016 Elsevier Inc.  Pharyngitis Pharyngitis is redness, pain, and swelling (inflammation) of your pharynx.  CAUSES  Pharyngitis is usually caused by infection. Most of the time, these infections are from viruses (viral) and are part of a cold. However, sometimes pharyngitis is caused by bacteria (bacterial). Pharyngitis can also be caused by allergies. Viral pharyngitis may be spread from person to person by coughing,  sneezing, and personal items or utensils (cups, forks, spoons, toothbrushes). Bacterial pharyngitis may be spread from person to person by more intimate contact, such as kissing.  SIGNS AND SYMPTOMS  Symptoms of pharyngitis include:   Sore throat.   Tiredness (fatigue).   Low-grade fever.   Headache.  Joint pain and muscle aches.  Skin rashes.  Swollen lymph nodes.  Plaque-like film on throat or tonsils (often seen with bacterial pharyngitis). DIAGNOSIS  Your health care provider will ask you questions about your illness and your symptoms. Your medical history, along with a physical exam, is often all that is needed to diagnose pharyngitis. Sometimes, a rapid strep test is done. Other lab tests may also be done, depending on the suspected cause.  TREATMENT  Viral pharyngitis will usually get better in 3-4 days without the use of medicine. Bacterial pharyngitis is treated with medicines that kill germs (antibiotics).  HOME CARE INSTRUCTIONS   Drink enough water and fluids to keep your urine clear or pale yellow.   Only take over-the-counter or prescription medicines as directed by your health care provider:   If you are prescribed antibiotics, make sure you finish them even if you start to feel better.   Do not take aspirin.   Get lots of rest.   Gargle with 8 oz of salt water ( tsp of salt per 1 qt of water) as often as every 1-2 hours to soothe your throat.   Throat lozenges (if you are not at risk for choking) or sprays may be used to soothe your throat. SEEK MEDICAL CARE IF:   You have large, tender lumps in your neck.  You have a rash.  You cough up green, yellow-brown, or bloody spit. SEEK IMMEDIATE MEDICAL CARE IF:   Your neck becomes stiff.  You drool or are unable to swallow liquids.  You vomit or are unable to keep medicines or liquids down.  You have severe pain that does not go away with the use of recommended medicines.  You have trouble  breathing (not caused by a stuffy nose). MAKE SURE YOU:   Understand these instructions.  Will watch your condition.  Will get help right away if you are not doing well or get worse.   This information is not intended to replace advice given to you by your health care provider. Make sure you discuss any questions you have with your health care provider.   Document Released: 12/30/2004 Document Revised: 10/20/2012 Document Reviewed: 09/06/2012 Elsevier Interactive Patient Education [employer]

## 2015-05-14 LAB — CULTURE, GROUP A STREP (THRC)

## 2015-11-22 ENCOUNTER — Encounter: Payer: Self-pay | Admitting: *Deleted

## 2015-11-22 DIAGNOSIS — Z79899 Other long term (current) drug therapy: Secondary | ICD-10-CM | POA: Diagnosis not present

## 2015-11-22 DIAGNOSIS — Z791 Long term (current) use of non-steroidal anti-inflammatories (NSAID): Secondary | ICD-10-CM | POA: Insufficient documentation

## 2015-11-22 DIAGNOSIS — J029 Acute pharyngitis, unspecified: Secondary | ICD-10-CM | POA: Diagnosis not present

## 2015-11-22 DIAGNOSIS — F17210 Nicotine dependence, cigarettes, uncomplicated: Secondary | ICD-10-CM | POA: Diagnosis not present

## 2015-11-22 LAB — POCT RAPID STREP A: STREPTOCOCCUS, GROUP A SCREEN (DIRECT): NEGATIVE

## 2015-11-22 NOTE — ED Triage Notes (Signed)
Pt has sore throat for 2 weeks.  Taking otc meds without relief.

## 2015-11-23 ENCOUNTER — Emergency Department
Admission: EM | Admit: 2015-11-23 | Discharge: 2015-11-23 | Disposition: A | Discharge status: Home / Self Care | Payer: 59 | Attending: Emergency Medicine | Admitting: Emergency Medicine

## 2015-11-23 DIAGNOSIS — J029 Acute pharyngitis, unspecified: Secondary | ICD-10-CM

## 2015-11-23 MED ORDER — AMOXICILLIN-POT CLAVULANATE 875-125 MG PO TABS: 1.0000 | ORAL_TABLET | Freq: Two times a day (BID) | ORAL | 0 refills | Status: AC

## 2015-11-23 MED ORDER — AMOXICILLIN-POT CLAVULANATE 875-125 MG PO TABS
1.0000 | ORAL_TABLET | Freq: Once | ORAL | Status: AC
  Administered 2015-11-23: 1 via ORAL
  Filled 2015-11-23: qty 1

## 2015-11-23 NOTE — ED Provider Notes (Signed)
[facility] Emergency Department Provider Note    First MD Initiated Contact with Patient 11/23/15 0100     (approximate)  I have reviewed the triage vital signs and the nursing notes.   HISTORY  Chief Complaint Sore Throat   HPI Adam Cantu is a 20 y.o. male presents with sore throat 2 weeks. Patient denies any fever no cough states that pain is unrelieved with over-the-counter medication.   Past Medical History:  Diagnosis Date  . Patient denies medical problems     There are no active problems to display for this patient.   Past surgical history None  Allergies No known drug allergies No family history on file.  Social History Social History  Substance Use Topics  . Smoking status: Current Some Day Smoker    Packs/day: 0.50    Types: Cigarettes  . Smokeless tobacco: Never Used  . Alcohol use No    Review of Systems Constitutional: No fever/chills Eyes: No visual changes. ENT: No sore throat. Cardiovascular: Denies chest pain. Respiratory: Denies shortness of breath. Gastrointestinal: No abdominal pain.  No nausea, no vomiting.  No diarrhea.  No constipation. Genitourinary: Negative for dysuria. Musculoskeletal: Negative for back pain. Skin: Negative for rash. Neurological: Negative for headaches, focal weakness or numbness.  10-point ROS otherwise negative.  ____________________________________________   PHYSICAL EXAM:  VITAL SIGNS: ED Triage Vitals  Enc Vitals Group     BP 11/22/15 2336 (!) 163/86     Pulse Rate 11/22/15 2336 85     Resp 11/22/15 2336 18     Temp 11/22/15 2336 98.3 F (36.8 C)     Temp Source 11/22/15 2336 Oral     SpO2 11/22/15 2336 99 %     Weight 11/22/15 2336 234 lb (106.1 kg)     Height 11/22/15 2336 5\' 10"  (1.778 m)     Head Circumference --      Peak Flow --      Pain Score 11/22/15 2337 9     Pain Loc --      Pain Edu? --      Excl. in GC? --     Constitutional: Alert and  oriented. Well appearing and in no acute distress. Eyes: Conjunctivae are normal. PERRL. EOMI. Head: Atraumatic. Ears:  Healthy appearing ear canals and TMs bilaterally Nose: No congestion/rhinnorhea. Mouth/Throat: Mucous membranes are moist.  Oropharynx non-erythematous. Neck: No stridor.  No cervical spine tenderness to palpation. Cardiovascular: Normal rate, regular rhythm. Good peripheral circulation. Grossly normal heart sounds. Respiratory: Normal respiratory effort.  No retractions. Lungs CTAB. Gastrointestinal: Soft and nontender. No distention.  Musculoskeletal: No lower extremity tenderness nor edema. No gross deformities of extremities. Neurologic:  Normal speech and language. No gross focal neurologic deficits are appreciated.  Skin:  Skin is warm, dry and intact. No rash noted. Psychiatric: Mood and affect are normal. Speech and behavior are normal.  ____________________________________________   LABS (all labs ordered are listed, but only abnormal results are displayed)  Labs Reviewed  POCT RAPID STREP A    RADIOLOGY I, Ulen N Rhilee Currin, personally viewed and evaluated these images (plain radiographs) as part of my medical decision making, as well as reviewing the written report by the radiologist.  No results found.    Procedures     INITIAL IMPRESSION / ASSESSMENT AND PLAN / ED COURSE  Pertinent labs & imaging results that were available during my care of the patient were reviewed by me and considered in my medical  decision making (see chart for details).     Clinical Course     ____________________________________________  FINAL CLINICAL IMPRESSION(S) / ED DIAGNOSES  Final diagnoses:  Pharyngitis, unspecified etiology     MEDICATIONS GIVEN DURING THIS VISIT:  Medications  amoxicillin-clavulanate (AUGMENTIN) 875-125 MG per tablet 1 tablet (not administered)     NEW OUTPATIENT MEDICATIONS STARTED DURING THIS VISIT:  New Prescriptions    No medications on file    Modified Medications   No medications on file    Discontinued Medications   No medications on file     Note:  This document was prepared using Dragon voice recognition software and may include unintentional dictation errors.    Darci Currentandolph N Josia Cueva, MD 11/23/15 [phone]

## 2015-11-23 NOTE — ED Notes (Signed)
Sore thoat x2 weeks, home tx OTC cough meds. No relief. Denies presence of blood

## 2015-11-23 NOTE — ED Notes (Signed)
ED provider at bedside.

## 2016-02-19 ENCOUNTER — Encounter: Payer: Self-pay | Admitting: *Deleted

## 2016-02-19 ENCOUNTER — Ambulatory Visit
Admission: EM | Admit: 2016-02-19 | Discharge: 2016-02-19 | Disposition: A | Discharge status: Home / Self Care | Payer: 59 | Attending: Family Medicine | Admitting: Family Medicine

## 2016-02-19 DIAGNOSIS — R112 Nausea with vomiting, unspecified: Secondary | ICD-10-CM

## 2016-02-19 DIAGNOSIS — R197 Diarrhea, unspecified: Secondary | ICD-10-CM

## 2016-02-19 MED ORDER — ONDANSETRON 8 MG PO TBDP: 8.0000 mg | ORAL_TABLET | Freq: Two times a day (BID) | ORAL | 0 refills | Status: DC

## 2016-02-19 NOTE — ED Provider Notes (Signed)
CSN: [id number]     Arrival date & time 02/19/16  0824 History   First MD Initiated Contact with Patient 02/19/16 [phone]     Chief Complaint  Patient presents with  . Nausea  . Emesis  . Diarrhea  . Fever  . Generalized Body Aches   (Consider location/radiation/quality/duration/timing/severity/associated sxs/prior Treatment) HPI  This a 21 year old male who presents with 24-hour history of nausea vomiting diarrhea fever and body aches. He has vomited 10 times since yesterday none this morning and has had diarrhea in equal amounts time since yesterday however today's already had 3 episodes. He denies any blood or mucus in his stools. Has had some mild periumbilical tenderness. He has recently eaten heard by family members who have not got sick. He works at a [employer]. Today he has helped felt somewhat better. He has not been able to eat solid food but has been drinking fluids.       Past Medical History:  Diagnosis Date  . Patient denies medical problems    History reviewed. No pertinent surgical history. History reviewed. No pertinent family history. Social History  Substance Use Topics  . Smoking status: Current Some Day Smoker    Packs/day: 0.50    Types: Cigarettes  . Smokeless tobacco: Never Used  . Alcohol use No    Review of Systems  Constitutional: Positive for activity change, appetite change, chills, fatigue and fever.  Gastrointestinal: Positive for abdominal pain, diarrhea, nausea and vomiting. Negative for abdominal distention.  All other systems reviewed and are negative.   Allergies  Patient has no known allergies.  Home Medications   Prior to Admission medications   Medication Sig Start Date End Date Taking? Authorizing Provider  ondansetron (ZOFRAN ODT) 8 MG disintegrating tablet Take 1 tablet (8 mg total) by mouth 2 (two) times daily. 02/19/16   Lutricia Feil, PA-C   Meds Ordered and Administered this Visit  Medications - No data to  display  BP 123/63 (BP Location: Left Arm)   Pulse (!) 101   Temp 99.1 F (37.3 C) (Oral)   Resp 16   Ht 6' (1.829 m)   Wt 224 lb (101.6 kg)   SpO2 100%   BMI 30.38 kg/m  No data found.   Physical Exam  Constitutional: He is oriented to person, place, and time. He appears well-developed and well-nourished. No distress.  HENT:  Head: Normocephalic and atraumatic.  Right Ear: External ear normal.  Left Ear: External ear normal.  Nose: Nose normal.  Mouth/Throat: Oropharynx is clear and moist. No oropharyngeal exudate.  Eyes: EOM are normal. Pupils are equal, round, and reactive to light. Right eye exhibits no discharge. Left eye exhibits no discharge.  Neck: Normal range of motion. Neck supple.  Pulmonary/Chest: Effort normal and breath sounds normal. No respiratory distress. He has no wheezes. He has no rales.  Abdominal: Soft. He exhibits no distension and no mass. There is tenderness. There is no rebound and no guarding.  Examination of the abdomen no distention. Bowel sounds are present but hypotonic. Patient does have some mild diffuse tenderness periumbilically and in both lower quadrants. There is no rebound no guarding no masses palpable.  Musculoskeletal: Normal range of motion.  Lymphadenopathy:    He has no cervical adenopathy.  Neurological: He is alert and oriented to person, place, and time.  Skin: Skin is warm and dry. He is not diaphoretic.  Psychiatric: He has a normal mood and affect. His behavior is normal. Judgment and  thought content normal.  Nursing note and vitals reviewed.   Urgent Care Course     Procedures (including critical care time)  Labs Review Labs Reviewed - No data to display  Imaging Review No results found.   Visual Acuity Review  Right Eye Distance:   Left Eye Distance:   Bilateral Distance:    Right Eye Near:   Left Eye Near:    Bilateral Near:         MDM   1. Nausea vomiting and diarrhea    Discharge Medication  List as of 02/19/2016  9:12 AM    START taking these medications   Details  ondansetron (ZOFRAN ODT) 8 MG disintegrating tablet Take 1 tablet (8 mg total) by mouth 2 (two) times daily., Starting Tue 02/19/2016, Normal      Plan: 1. Test/x-ray results and diagnosis reviewed with patient 2. rx as per orders; risks, benefits, potential side effects reviewed with patient 3. Recommend supportive treatment with Increasing fluids. When returning to solid foods begin with the BRAT diet and advance as tolerated and if he runs a high fever or has increased localized abdominal pain he should go to emergency department 4. F/u prn if symptoms worsen or don't improve     Lutricia FeilWilliam P Roemer, PA-C 02/19/16 [phone]

## 2016-02-19 NOTE — ED Triage Notes (Signed)
N/V/D, fever, body aches x 24 hours.

## 2016-02-21 ENCOUNTER — Telehealth: Payer: Self-pay

## 2016-02-21 NOTE — Telephone Encounter (Signed)
Tried calling for patient courtesy call back. No answer, no VM set up. Patient will call with any questions or concerns. [facility]

## 2016-07-09 ENCOUNTER — Ambulatory Visit: Payer: Self-pay | Admitting: Family Medicine

## 2016-12-29 ENCOUNTER — Other Ambulatory Visit: Payer: Self-pay

## 2016-12-29 ENCOUNTER — Emergency Department
Admission: EM | Admit: 2016-12-29 | Discharge: 2016-12-29 | Disposition: A | Discharge status: Home / Self Care | Payer: 59 | Attending: Emergency Medicine | Admitting: Emergency Medicine

## 2016-12-29 ENCOUNTER — Encounter: Payer: Self-pay | Admitting: Emergency Medicine

## 2016-12-29 DIAGNOSIS — Z79899 Other long term (current) drug therapy: Secondary | ICD-10-CM | POA: Diagnosis not present

## 2016-12-29 DIAGNOSIS — R002 Palpitations: Secondary | ICD-10-CM

## 2016-12-29 DIAGNOSIS — I493 Ventricular premature depolarization: Secondary | ICD-10-CM | POA: Insufficient documentation

## 2016-12-29 DIAGNOSIS — F17210 Nicotine dependence, cigarettes, uncomplicated: Secondary | ICD-10-CM | POA: Diagnosis not present

## 2016-12-29 LAB — CBC
HCT: 44.4 % (ref 40.0–52.0)
HEMOGLOBIN: 14.9 g/dL (ref 13.0–18.0)
MCH: 29.7 pg (ref 26.0–34.0)
MCHC: 33.5 g/dL (ref 32.0–36.0)
MCV: 88.8 fL (ref 80.0–100.0)
Platelets: 263 10*3/uL (ref 150–440)
RBC: 5 MIL/uL (ref 4.40–5.90)
RDW: 13 % (ref 11.5–14.5)
WBC: 11.1 10*3/uL — ABNORMAL HIGH (ref 3.8–10.6)

## 2016-12-29 LAB — COMPREHENSIVE METABOLIC PANEL
ALT: 30 U/L (ref 17–63)
ANION GAP: 6 (ref 5–15)
AST: 24 U/L (ref 15–41)
Albumin: 4.8 g/dL (ref 3.5–5.0)
Alkaline Phosphatase: 77 U/L (ref 38–126)
BUN: 12 mg/dL (ref 6–20)
CO2: 28 mmol/L (ref 22–32)
CREATININE: 0.81 mg/dL (ref 0.61–1.24)
Calcium: 9.6 mg/dL (ref 8.9–10.3)
Chloride: 105 mmol/L (ref 101–111)
GFR calc non Af Amer: >60 mL/min (ref >60)
Glucose, Bld: 98 mg/dL (ref 65–99)
Potassium: 3.8 mmol/L (ref 3.5–5.1)
SODIUM: 139 mmol/L (ref 135–145)
Total Bilirubin: 0.6 mg/dL (ref 0.3–1.2)
Total Protein: 7.7 g/dL (ref 6.5–8.1)

## 2016-12-29 LAB — URINE DRUG SCREEN, QUALITATIVE (ARMC ONLY)
AMPHETAMINES, UR SCREEN: NOT DETECTED
Barbiturates, Ur Screen: NOT DETECTED
Benzodiazepine, Ur Scrn: NOT DETECTED
COCAINE METABOLITE, UR ~~LOC~~: NOT DETECTED
Cannabinoid 50 Ng, Ur ~~LOC~~: POSITIVE — AB
MDMA (ECSTASY) UR SCREEN: NOT DETECTED
METHADONE SCREEN, URINE: NOT DETECTED
Opiate, Ur Screen: NOT DETECTED
Phencyclidine (PCP) Ur S: NOT DETECTED
TRICYCLIC, UR SCREEN: NOT DETECTED

## 2016-12-29 LAB — TROPONIN I

## 2016-12-29 LAB — MAGNESIUM: Magnesium: 2 mg/dL (ref 1.7–2.4)

## 2016-12-29 LAB — TSH: TSH: 2.417 u[IU]/mL (ref 0.350–4.500)

## 2016-12-29 NOTE — ED Triage Notes (Signed)
Feeling of rapid heart rate since 8am today. Radial pulse felt, regular and 70s. States has the sensation at this time. States has intermittent symptoms x 2 years.

## 2016-12-29 NOTE — ED Notes (Signed)
Pt reports that he feels "like my heart stops sometimes" and "it twitches." States that has been happening for 2 years, but it has gotten worse, and today is the worst it has ever been. Denies chest pain, states it "feels clogged." Pt has never sought treatment before. When it happens, pt states he sometimes has sob, nausea. Pt alert & oriented with NAD noted.

## 2016-12-29 NOTE — ED Notes (Signed)
Pt given urinal to obtain urine specimen.

## 2016-12-29 NOTE — ED Provider Notes (Signed)
[facility] Emergency Department Provider Note  ____________________________________________  Time seen: Approximately 6:47 PM  I have reviewed the triage vital signs and the nursing notes.   HISTORY  Chief Complaint Palpitations   HPI Adam Cantu is a 21 y.o. male with no significant history who presents for evaluation of palpitations. Patient reports that he has been having palpitations on and off for about 2 years. Says he usually has them once a month and they usually last a few seconds at a time. Today at 8 AM while he was at work he started experiencing palpitations which have been present throughout the whole day which prompted his visit to the emergency room. No syncope, no chest pain or shortness of breath, no family history of sudden death or arrhythmias. Patient endorses drinking alcohol several days a week and smokes marijuana. Used to do cocaine however last time was a month ago. He reports his symptoms as feeling like his heart is skipping a beat.    Past Medical History:  Diagnosis Date  . Patient denies medical problems     There are no active problems to display for this patient.   History reviewed. No pertinent surgical history.  Prior to Admission medications   Medication Sig Start Date End Date Taking? Authorizing Provider  ondansetron (ZOFRAN ODT) 8 MG disintegrating tablet Take 1 tablet (8 mg total) by mouth 2 (two) times daily. 02/19/16   Lutricia Feiloemer, William P, PA-C    Allergies Patient has no known allergies.  No family history on file.  Social History Social History   Tobacco Use  . Smoking status: Current Some Day Smoker    Packs/day: 0.50    Types: Cigarettes  . Smokeless tobacco: Never Used  Substance Use Topics  . Alcohol use: No  . Drug use: Not on file    Review of Systems  Constitutional: Negative for fever. Eyes: Negative for visual changes. ENT: Negative for sore throat. Neck: No neck pain    Cardiovascular: Negative for chest pain. + palpitations Respiratory: Negative for shortness of breath. Gastrointestinal: Negative for abdominal pain, vomiting or diarrhea. Genitourinary: Negative for dysuria. Musculoskeletal: Negative for back pain. Skin: Negative for rash. Neurological: Negative for headaches, weakness or numbness. Psych: No SI or HI  ____________________________________________   PHYSICAL EXAM:  VITAL SIGNS: ED Triage Vitals  Enc Vitals Group     BP 12/29/16 1714 (!) 148/104     Pulse Rate 12/29/16 1714 72     Resp 12/29/16 1714 18     Temp 12/29/16 1714 98 F (36.7 C)     Temp Source 12/29/16 1714 Oral     SpO2 12/29/16 1714 99 %     Weight 12/29/16 1715 234 lb (106.1 kg)     Height 12/29/16 1715 6' (1.829 m)     Head Circumference --      Peak Flow --      Pain Score --      Pain Loc --      Pain Edu? --      Excl. in GC? --     Constitutional: Alert and oriented. Well appearing and in no apparent distress. HEENT:      Head: Normocephalic and atraumatic.         Eyes: Conjunctivae are normal. Sclera is non-icteric.       Mouth/Throat: Mucous membranes are moist.       Neck: Supple with no signs of meningismus. Cardiovascular: Regular rate and rhythm. No murmurs,  gallops, or rubs. 2+ symmetrical distal pulses are present in all extremities. No JVD. Respiratory: Normal respiratory effort. Lungs are clear to auscultation bilaterally. No wheezes, crackles, or rhonchi.  Gastrointestinal: Soft, non tender, and non distended with positive bowel sounds. No rebound or guarding. Genitourinary: No CVA tenderness. Musculoskeletal: Nontender with normal range of motion in all extremities. No edema, cyanosis, or erythema of extremities. Neurologic: Normal speech and language. Face is symmetric. Moving all extremities. No gross focal neurologic deficits are appreciated. Skin: Skin is warm, dry and intact. No rash noted. Psychiatric: Mood and affect are normal.  Speech and behavior are normal.  ____________________________________________   LABS (all labs ordered are listed, but only abnormal results are displayed)  Labs Reviewed  CBC - Abnormal; Notable for the following components:      Result Value   WBC 11.1 (*)    All other components within normal limits  TROPONIN I  COMPREHENSIVE METABOLIC PANEL  TSH  MAGNESIUM  URINE DRUG SCREEN, QUALITATIVE ([facility] ONLY)   ____________________________________________  EKG  ED ECG REPORT I, Nita Sicklearolina Clovis Warwick, the attending physician, personally viewed and interpreted this ECG.  Normal sinus rhythm, normal intervals, normal axis, no STE or depressions, no evidence of HOCM, AV block, delta wave, ARVD, prolonged QTc, WPW, or Brugada.  ____________________________________________  RADIOLOGY  none  ____________________________________________   PROCEDURES  Procedure(s) performed: None Procedures Critical Care performed:  None ____________________________________________   INITIAL IMPRESSION / ASSESSMENT AND PLAN / ED COURSE  21 y.o. male with no significant history who presents for evaluation of palpitations. Patient experiencing the palpitations while my evaluation and while connected to telemetry showing occasional PVCs which coincide with his symptoms. His EKG shows no evidence of dysrhythmias or ischemia. We'll check electrolytes, TSH, troponin, and blood count to to rule out possible etiologies for his palpitations. Patient will be closely monitored on telemetry for several hours. If no abnormal arrhythmias and workup negative patients can be referred to cardiology for Holter monitoring.    _________________________ 7:54 PM on 12/29/2016 -----------------------------------------  Labs showing normal electrolytes, normal thyroid, negative troponin, and no evidence of anemia. Patient continues to have occasional PVCs on telemetry with no other arrhythmias observed her during his 3 hour  telemetry monitoring. He is to be discharged with close follow-up with cardiology for further monitoring. Discussed return precautions for any signs of dizziness, syncope, chest pain, shortness of breath.   As part of my medical decision making, I reviewed the following data within the electronic MEDICAL RECORD NUMBER Nursing notes reviewed and incorporated, Labs reviewed , EKG interpreted , Notes from prior ED visits and [place] Controlled Substance Database    Pertinent labs & imaging results that were available during my care of the patient were reviewed by me and considered in my medical decision making (see chart for details).    ____________________________________________   FINAL CLINICAL IMPRESSION(S) / ED DIAGNOSES  Final diagnoses:  Palpitations  PVC (premature ventricular contraction)      NEW MEDICATIONS STARTED DURING THIS VISIT:  ED Discharge Orders    None       Note:  This document was prepared using Dragon voice recognition software and may include unintentional dictation errors.    Nita SickleVeronese, [place], MD 12/29/16 [phone]

## 2019-01-28 DIAGNOSIS — U071 COVID-19: Secondary | ICD-10-CM | POA: Diagnosis not present

## 2019-02-06 DIAGNOSIS — Z03818 Encounter for observation for suspected exposure to other biological agents ruled out: Secondary | ICD-10-CM | POA: Diagnosis not present

## 2019-02-06 DIAGNOSIS — Z20828 Contact with and (suspected) exposure to other viral communicable diseases: Secondary | ICD-10-CM | POA: Diagnosis not present

## 2019-03-23 ENCOUNTER — Encounter: Payer: Self-pay | Admitting: Family Medicine

## 2019-03-23 ENCOUNTER — Other Ambulatory Visit: Payer: Self-pay

## 2019-03-23 ENCOUNTER — Ambulatory Visit (INDEPENDENT_AMBULATORY_CARE_PROVIDER_SITE_OTHER): Payer: BC Managed Care – PPO | Admitting: Family Medicine

## 2019-03-23 VITALS — BP 120/70 | HR 87 | Temp 97.1°F | Resp 18 | Ht 71.0 in | Wt 232.1 lb

## 2019-03-23 DIAGNOSIS — Z23 Encounter for immunization: Secondary | ICD-10-CM | POA: Diagnosis not present

## 2019-03-23 DIAGNOSIS — Z720 Tobacco use: Secondary | ICD-10-CM | POA: Diagnosis not present

## 2019-03-23 DIAGNOSIS — F1290 Cannabis use, unspecified, uncomplicated: Secondary | ICD-10-CM | POA: Diagnosis not present

## 2019-03-23 DIAGNOSIS — F902 Attention-deficit hyperactivity disorder, combined type: Secondary | ICD-10-CM | POA: Diagnosis not present

## 2019-03-23 DIAGNOSIS — F419 Anxiety disorder, unspecified: Secondary | ICD-10-CM

## 2019-03-23 MED ORDER — ATOMOXETINE HCL 18 MG PO CAPS: 18.0000 mg | ORAL_CAPSULE | Freq: Every day | ORAL | 0 refills | Status: DC

## 2019-03-23 MED ORDER — ESCITALOPRAM OXALATE 5 MG PO TABS: 5.0000 mg | ORAL_TABLET | Freq: Every day | ORAL | 0 refills | Status: DC

## 2019-03-23 NOTE — Progress Notes (Signed)
Name: Adam Cantu   MRN: [id number]    DOB: [birth year]   Date:03/23/2019       Progress Note  Subjective  Chief Complaint  Chief Complaint  Patient presents with  . Establish Care  . ADHD    stopped medication on his own 3 years ago and would like to discuss going back on medication    HPI  ADHD: he was diagnosed with ADHD as a child, he used to take stimulants, last medication was Vyvanse a couple of years ago. 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 Explained that we will try starting him on a medication that is not controlled so he has time to come off Marijuana and cut down on drinking.   Anxiety: he states he has some social anxiety, feels nervous before going to new environments sometimes he gets sweaty, but no history of panic attacks. He uses marijuana because it makes him feel calm and relaxed and sometimes helps with his focus. We will start him on medication for anxiety  Alcohol misuse: advised to cut down on drinking no more than 6 per day/sitting   Past Surgical History:  Procedure Laterality Date  . NO PAST SURGERIES      Family History  Problem Relation Age of Onset  . Anxiety disorder Mother   . Asthma Mother   . Obesity Father   . Obesity Sister     Social History   Tobacco Use  . Smoking status: Current Some Day Smoker    Packs/day: 0.10    Types: Cigarettes    Start date: 03/22/2017  . Smokeless tobacco: Never Used  Substance Use Topics  . Alcohol use: Yes    Alcohol/week: 28.0 standard drinks    Types: 24 Cans of beer, 4 Shots of liquor per week    No current outpatient medications on file.  No Known Allergies  I personally reviewed active problem list, medication list, allergies, family history, social history, health maintenance with the patient/caregiver today.   ROS  Constitutional: Negative for fever or weight change.  Respiratory:  Negative for cough and shortness of breath.   Cardiovascular: Negative for chest pain or palpitations.  Gastrointestinal: Negative for abdominal pain, no bowel changes.  Musculoskeletal: Negative for gait problem or joint swelling.  Skin: Negative for rash.  Neurological: Negative for dizziness or headache.  No other specific complaints in a complete review of systems (except as listed in HPI above).  Objective  Vitals:   03/23/19 1005  BP: 120/70  Pulse: 87  Resp: 18  Temp: (!) 97.1 F (36.2 C)  TempSrc: Temporal  SpO2: 93%  Weight: 232 lb 1.6 oz (105.3 kg)  Height: 5\' 11"  (1.803 m)    Body mass index is 32.37 kg/m.  Physical Exam  Constitutional: Patient appears well-developed and well-nourished. Obese  No distress.  HEENT: head atraumatic, normocephalic, pupils equal and reactive to light Cardiovascular: Normal rate, regular rhythm and normal heart sounds.  No murmur heard. No BLE edema. Pulmonary/Chest: Effort normal and breath sounds normal. No respiratory distress. Abdominal: Soft.  There is no tenderness. Psychiatric: Patient has a normal mood and affect. behavior is normal. Judgment and thought content normal.  PHQ2/9: Depression screen [facility] 2/9 03/23/2019  Decreased Interest 0  Down, Depressed, Hopeless 0  PHQ - 2 Score 0  Altered sleeping 0  Tired, decreased energy 0  Change in appetite  0  Feeling bad or failure about yourself  0  Trouble concentrating 1  Moving slowly or fidgety/restless 0  Suicidal thoughts 0  PHQ-9 Score 1    phq 9 is negative  GAD 7 : Generalized Anxiety Score 03/23/2019  Nervous, Anxious, on Edge 2  Control/stop worrying 0  Worry too much - different things 1  Trouble relaxing 0  Restless 1  Easily annoyed or irritable 3  Afraid - awful might happen 2  Total GAD 7 Score 9  Anxiety Difficulty Not difficult at all    Fall Risk: Fall Risk  03/23/2019  Falls in the past year? 0  Number falls in past yr: 0  Injury with Fall? 0   Follow up Falls evaluation completed     Assessment & Plan  1. Attention deficit hyperactivity disorder (ADHD), combined type  - atomoxetine (STRATTERA) 18 MG capsule; Take 1 capsule (18 mg total) by mouth daily. For ADD  Dispense: 30 capsule; Refill: 0  2. Need for Tdap vaccination  - Tdap vaccine greater than or equal to 7yo IM  3. Tobacco use  Advised to stop nicotine   4. Marijuana use  Discussed importance of decreasing it   5. Anxiety  - escitalopram (LEXAPRO) 5 MG tablet; Take 1 tablet (5 mg total) by mouth daily.  Dispense: 30 tablet; Refill: 0

## 2019-04-25 ENCOUNTER — Ambulatory Visit: Payer: BC Managed Care – PPO | Admitting: Family Medicine

## 2021-06-24 ENCOUNTER — Ambulatory Visit (INDEPENDENT_AMBULATORY_CARE_PROVIDER_SITE_OTHER): Payer: 59

## 2021-06-24 ENCOUNTER — Ambulatory Visit
Admission: EM | Admit: 2021-06-24 | Discharge: 2021-06-24 | Disposition: A | Discharge status: Home / Self Care | Payer: 59 | Attending: Student | Admitting: Student

## 2021-06-24 ENCOUNTER — Encounter: Payer: Self-pay | Admitting: Emergency Medicine

## 2021-06-24 ENCOUNTER — Other Ambulatory Visit: Payer: Self-pay

## 2021-06-24 DIAGNOSIS — S39012A Strain of muscle, fascia and tendon of lower back, initial encounter: Secondary | ICD-10-CM

## 2021-06-24 DIAGNOSIS — M545 Low back pain, unspecified: Secondary | ICD-10-CM

## 2021-06-24 MED ORDER — MELOXICAM 7.5 MG PO TABS: 7.5000 mg | ORAL_TABLET | Freq: Every day | ORAL | 0 refills | Status: AC

## 2021-06-24 MED ORDER — TIZANIDINE HCL 4 MG PO TABS: 4.0000 mg | ORAL_TABLET | Freq: Three times a day (TID) | ORAL | 0 refills | Status: AC | PRN

## 2021-06-24 MED ORDER — LIDOCAINE 5 % EX PTCH: 1.0000 | MEDICATED_PATCH | CUTANEOUS | 0 refills | Status: AC

## 2021-06-24 NOTE — ED Triage Notes (Signed)
History of back pain.  This episode started approx one hour ago. 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  Pain in lower back.

## 2021-06-24 NOTE — ED Provider Notes (Signed)
[facility]    CSN: [id number] Arrival date & time: 06/24/21  1335      History   Chief Complaint Chief Complaint  Patient presents with   Back Pain    HPI Adam Cantu is a 26 y.o. male presenting with back pain for 1 hour.  History noncontributory.  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  He states the pain is worse with standing, and there is some new right-sided sciatica radiating through the buttock.  Denies new sensation changes or weakness in the arms or the legs.  He states he has had back pain in the past, but no formal diagnoses or injuries.  Denies urinary symptoms like constipation or urinary retention.  HPI  Past Medical History:  Diagnosis Date   ADHD     There are no problems to display for this patient.   Past Surgical History:  Procedure Laterality Date   NO PAST SURGERIES         Home Medications    Prior to Admission medications   Medication Sig Start Date End Date Taking? Authorizing Provider  atomoxetine (STRATTERA) 18 MG capsule Take 1 capsule (18 mg total) by mouth daily. For ADD Patient not taking: Reported on 06/24/2021 03/23/19   Alba CorySowles, Krichna, MD  escitalopram (LEXAPRO) 5 MG tablet Take 1 tablet (5 mg total) by mouth daily. Patient not taking: Reported on 06/24/2021 03/23/19   Alba CorySowles, Krichna, MD    Family History Family History  Problem Relation Age of Onset   Anxiety disorder Mother    Asthma Mother    Obesity Father    Obesity Sister     Social History Social History   Tobacco Use   Smoking status: Some Days    Packs/day: 0.10    Types: Cigarettes    Start date: 03/22/2017   Smokeless tobacco: Never  Vaping Use   Vaping Use: Some days   Substances: Nicotine  Substance Use Topics   Alcohol use: Yes    Alcohol/week: 28.0 standard drinks of alcohol    Types: 24 Cans of beer, 4 Shots of liquor  per week   Drug use: Yes    Types: Marijuana     Allergies   Patient has no known allergies.   Review of Systems Review of Systems  Musculoskeletal:  Positive for back pain.  All other systems reviewed and are negative.    Physical Exam Triage Vital Signs ED Triage Vitals  Enc Vitals Group     BP 06/24/21 1403 119/76     Pulse Rate 06/24/21 1403 61     Resp 06/24/21 1403 18     Temp 06/24/21 1403 98.9 F (37.2 C)     Temp Source 06/24/21 1403 Oral     SpO2 06/24/21 1403 98 %     Weight --      Height --      Head Circumference --      Peak Flow --      Pain Score 06/24/21 1401 10     Pain Loc --      Pain Edu? --      Excl. in GC? --    No data found.  Updated Vital Signs BP 119/76 (BP Location: Left Arm)   Pulse 61   Temp 98.9 F (37.2 C) (Oral)   Resp 18  SpO2 98%   Visual Acuity Right Eye Distance:   Left Eye Distance:   Bilateral Distance:    Right Eye Near:   Left Eye Near:    Bilateral Near:     Physical Exam Vitals reviewed.  Constitutional:      General: He is not in acute distress.    Appearance: Normal appearance. He is not ill-appearing.  HENT:     Head: Normocephalic and atraumatic.  Cardiovascular:     Rate and Rhythm: Normal rate and regular rhythm.     Heart sounds: Normal heart sounds.  Pulmonary:     Effort: Pulmonary effort is normal.     Breath sounds: Normal breath sounds and air entry.  Abdominal:     Tenderness: There is no abdominal tenderness. There is no right CVA tenderness, left CVA tenderness, guarding or rebound.  Musculoskeletal:     Cervical back: Normal range of motion. No swelling, deformity, signs of trauma, rigidity, spasms, tenderness, bony tenderness or crepitus. No pain with movement.     Thoracic back: No swelling, deformity, signs of trauma, spasms, tenderness or bony tenderness. Normal range of motion. No scoliosis.     Lumbar back: Spasms, tenderness and bony tenderness present. No swelling, deformity  or signs of trauma. Normal range of motion. Positive right straight leg raise test. Negative left straight leg raise test. No scoliosis.     Comments: There is tenderness to palpation of the bilateral lumbar paraspinous muscles, as well as the lumbar spine. No midline spinous deformity, stepoff.  Pain is elicited with extension lumbar spine.  Positive right straight leg raise.  Gait intact, strength and sensation intact lower extremities, no saddle anesthesia.  Heel and toe walk intact.  Absolutely no other injury, deformity, tenderness, ecchymosis, abrasion.  Neurological:     General: No focal deficit present.     Mental Status: He is alert.     Cranial Nerves: No cranial nerve deficit.  Psychiatric:        Mood and Affect: Mood normal.        Behavior: Behavior normal.        Thought Content: Thought content normal.        Judgment: Judgment normal.      UC Treatments / Results  Labs (all labs ordered are listed, but only abnormal results are displayed) Labs Reviewed - No data to display  EKG   Radiology DG Lumbar Spine 2-3 Views  Result Date: 06/24/2021 CLINICAL DATA:  Lifting injury 1 hour ago with midline spinous tenderness. EXAM: LUMBAR SPINE - 2-3 VIEW COMPARISON:  None Available. FINDINGS: There are 5 lumbar type vertebral bodies. The alignment is normal. The disc spaces are preserved. No evidence of acute fracture or pars defect. The surrounding soft tissues appear unremarkable. IMPRESSION: Normal lumbar spine radiographs. Electronically Signed   By: Carey Bullocks M.D.   On: 06/24/2021 15:07    Procedures Procedures (including critical care time)  Medications Ordered in UC Medications - No data to display  Initial Impression / Assessment and Plan / UC Course  I have reviewed the triage vital signs and the nursing notes.  Pertinent labs & imaging results that were available during my care of the patient were reviewed by me and considered in my medical decision making  (see chart for details).     This patient is a very pleasant 26 y.o. year old male presenting with lumbar strain x1 hour.  Given new onset of right-sided sciatica extending through the buttock,  did check a Xray lumbar spine; Normal lumbar spine radiographs. Zanaflex, lidocaine patches, meloxicam. Work note provided. ED return precautions discussed. Patient verbalizes understanding and agreement.    Final Clinical Impressions(s) / UC Diagnoses   Final diagnoses:  Strain of lumbar region, initial encounter     Discharge Instructions      -Start the muscle relaxer-Zanaflex (tizanidine), up to 3 times daily for muscle spasms and pain.  This can make you drowsy, so take at bedtime or when you do not need to drive or operate machinery. -Lidocaine patch, 1 patch daily.  Do not use heating pad at the same time. -You can also take the meloxicam, up to every 12 hours, taken with food. -Heating pad       ED Prescriptions   None    PDMP not reviewed this encounter.   Rhys Martini, PA-C 06/24/21 1512

## 2021-06-24 NOTE — Discharge Instructions (Addendum)
-  Start the muscle relaxer-Zanaflex (tizanidine), up to 3 times daily for muscle spasms and pain.  This can make you drowsy, so take at bedtime or when you do not need to drive or operate machinery. -Lidocaine patch, 1 patch daily.  Do not use heating pad at the same time. -You can also take the meloxicam, up to every 12 hours, taken with food. Avoid other NSAIDs while taking this medication, including ibuprofen -Heating pad

## 2021-09-06 ENCOUNTER — Ambulatory Visit
Admission: EM | Admit: 2021-09-06 | Discharge: 2021-09-06 | Disposition: A | Discharge status: Home / Self Care | Payer: 59 | Attending: Family Medicine | Admitting: Family Medicine

## 2021-09-06 ENCOUNTER — Encounter: Payer: Self-pay | Admitting: Emergency Medicine

## 2021-09-06 DIAGNOSIS — W57XXXA Bitten or stung by nonvenomous insect and other nonvenomous arthropods, initial encounter: Secondary | ICD-10-CM | POA: Diagnosis not present

## 2021-09-06 DIAGNOSIS — S1086XA Insect bite of other specified part of neck, initial encounter: Secondary | ICD-10-CM

## 2021-09-06 MED ORDER — DOXYCYCLINE HYCLATE 100 MG PO CAPS: 100.0000 mg | ORAL_CAPSULE | Freq: Two times a day (BID) | ORAL | 0 refills | Status: AC

## 2021-09-06 NOTE — Discharge Instructions (Signed)
Stop by the pharmacy to pick up your prescriptions.  Follow up with your primary care provider as needed.  

## 2021-09-06 NOTE — ED Triage Notes (Signed)
Patient states that on Monday he got bit by a tick on the right side of his neck.  Patient c/o redness, swelling and tenderness at the site. Patient reports chills.
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
## 2022-02-23 ENCOUNTER — Emergency Department: Payer: 59

## 2022-02-23 ENCOUNTER — Other Ambulatory Visit: Payer: Self-pay

## 2022-02-23 ENCOUNTER — Emergency Department
Admission: EM | Admit: 2022-02-23 | Discharge: 2022-02-23 | Disposition: A | Discharge status: Home / Self Care | Payer: 59 | Attending: Emergency Medicine | Admitting: Emergency Medicine

## 2022-02-23 DIAGNOSIS — R0602 Shortness of breath: Secondary | ICD-10-CM | POA: Diagnosis not present

## 2022-02-23 DIAGNOSIS — Z859 Personal history of malignant neoplasm, unspecified: Secondary | ICD-10-CM | POA: Insufficient documentation

## 2022-02-23 DIAGNOSIS — R079 Chest pain, unspecified: Secondary | ICD-10-CM | POA: Diagnosis present

## 2022-02-23 DIAGNOSIS — R110 Nausea: Secondary | ICD-10-CM | POA: Diagnosis not present

## 2022-02-23 LAB — BASIC METABOLIC PANEL
Anion gap: 9 (ref 5–15)
BUN: 19 mg/dL (ref 6–20)
CO2: 22 mmol/L (ref 22–32)
Calcium: 9.2 mg/dL (ref 8.9–10.3)
Chloride: 106 mmol/L (ref 98–111)
Creatinine, Ser: 0.9 mg/dL (ref 0.61–1.24)
GFR, Estimated: >60 mL/min (ref >60)
Glucose, Bld: 101 mg/dL — ABNORMAL HIGH (ref 70–99)
Potassium: 3.6 mmol/L (ref 3.5–5.1)
Sodium: 137 mmol/L (ref 135–145)

## 2022-02-23 LAB — D-DIMER, QUANTITATIVE: D-Dimer, Quant: <0.27 ug/mL-FEU (ref 0.00–0.50)

## 2022-02-23 LAB — CBC
HCT: 46.6 % (ref 39.0–52.0)
Hemoglobin: 15.5 g/dL (ref 13.0–17.0)
MCH: 30.2 pg (ref 26.0–34.0)
MCHC: 33.3 g/dL (ref 30.0–36.0)
MCV: 90.7 fL (ref 80.0–100.0)
Platelets: 257 10*3/uL (ref 150–400)
RBC: 5.14 MIL/uL (ref 4.22–5.81)
RDW: 11.8 % (ref 11.5–15.5)
WBC: 8.2 10*3/uL (ref 4.0–10.5)
nRBC: 0 % (ref 0.0–0.2)

## 2022-02-23 LAB — TROPONIN I (HIGH SENSITIVITY)
Troponin I (High Sensitivity): 3 ng/L (ref <18)
Troponin I (High Sensitivity): 3 ng/L (ref <18)

## 2022-02-23 MED ORDER — ONDANSETRON 4 MG PO TBDP
4.0000 mg | ORAL_TABLET | Freq: Once | ORAL | Status: AC
  Administered 2022-02-23: 4 mg via ORAL
  Filled 2022-02-23: qty 1

## 2022-02-23 MED ORDER — ALUM & MAG HYDROXIDE-SIMETH 200-200-20 MG/5ML PO SUSP
30.0000 mL | Freq: Once | ORAL | Status: AC
  Administered 2022-02-23: 30 mL via ORAL
  Filled 2022-02-23: qty 30

## 2022-02-23 MED ORDER — KETOROLAC TROMETHAMINE 15 MG/ML IJ SOLN: 15.0000 mg | Freq: Once | INTRAMUSCULAR | Status: DC

## 2022-02-23 MED ORDER — OXYCODONE-ACETAMINOPHEN 5-325 MG PO TABS
1.0000 | ORAL_TABLET | Freq: Once | ORAL | Status: AC
  Administered 2022-02-23: 1 via ORAL
  Filled 2022-02-23: qty 1

## 2022-02-23 MED ORDER — KETOROLAC TROMETHAMINE 30 MG/ML IJ SOLN
15.0000 mg | Freq: Once | INTRAMUSCULAR | Status: AC
  Administered 2022-02-23: 15 mg via INTRAVENOUS
  Filled 2022-02-23: qty 1

## 2022-02-23 NOTE — ED Triage Notes (Signed)
Pt in with L chest pressure that woke him up from sleep just before arrival. States the pain is 10/10, worse with movement, and deep breaths. C/o sob and nausea. Diaphoretic throughout triage

## 2022-02-23 NOTE — ED Provider Notes (Signed)
[facility] Provider Note    Event Date/Time   First MD Initiated Contact with Patient 02/23/22 [id number]     (approximate)   History   Chest Pain   HPI  Adam Cantu is a 27 y.o. male  with pmh ADHD who p/w chest pain.  Symptoms started about an hour prior to arrival woke him up from sleep at 6 AM.  He endorses central chest pressure/tightness radiates to the back.  Is associated with some sweating nausea.  Felt short of breath earlier but not currently.  Was constant for about 30 minutes and has been coming and going since.  Feels worse when he moves around the bed and worse when he takes deep breath.  Denies history of similar.  Has 2 cousins on his dad side that passed away of heart attacks in their 30s.  He smokes and uses marijuana but no other drug use no cocaine.  Does have history of acid reflux. The patient denies hx of prior DVT/PE, unilateral leg pain/swelling, hormone use, recent surgery, hx of cancer, prolonged immobilization, or hemoptysis.       Past Medical History:  Diagnosis Date   ADHD     There are no problems to display for this patient.    Physical Exam  Triage Vital Signs: ED Triage Vitals  Enc Vitals Group     BP 02/23/22 0657 (!) 156/107     Pulse Rate 02/23/22 0657 66     Resp 02/23/22 0657 (!) 24     Temp --      Temp src --      SpO2 02/23/22 0657 97 %     Weight 02/23/22 0658 230 lb (104.3 kg)     Height --      Head Circumference --      Peak Flow --      Pain Score --      Pain Loc --      Pain Edu? --      Excl. in [place]? --     Most recent vital signs: Vitals:   02/23/22 0700 02/23/22 0700  BP: (!) 145/94   Pulse: 70   Resp: 18   Temp:  97.8 F (36.6 C)  SpO2: 100%      General: Awake, patient looks somewhat uncomfortable, diaphoretic CV:  Good peripheral perfusion. No edema Resp:  Normal effort.  Lungs are clear no increased work of breathing Abd:  No distention.  Neuro:             Awake, Alert,  Oriented x 3  Other:  Tenderness to palpation along the anterior chest wall   ED Results / Procedures / Treatments  Labs (all labs ordered are listed, but only abnormal results are displayed) Labs Reviewed  BASIC METABOLIC PANEL - Abnormal; Notable for the following components:      Result Value   Glucose, Bld 101 (*)    All other components within normal limits  CBC  D-DIMER, QUANTITATIVE  TROPONIN I (HIGH SENSITIVITY)  TROPONIN I (HIGH SENSITIVITY)     EKG  EKG interpretation performed by myself: NSR, nml axis, nml intervals, no acute ischemic changes    RADIOLOGY I reviewed and interpreted the CXR which does not show any acute cardiopulmonary process    PROCEDURES:  Critical Care performed: No  Procedures \ The patient is on the cardiac monitor to evaluate for evidence of arrhythmia and/or significant heart rate changes.   MEDICATIONS ORDERED IN  ED: Medications  ondansetron (ZOFRAN-ODT) disintegrating tablet 4 mg (4 mg Oral Given 02/23/22 0727)  alum & mag hydroxide-simeth (MAALOX/MYLANTA) 200-200-20 MG/5ML suspension 30 mL (30 mLs Oral Given 02/23/22 0727)  oxyCODONE-acetaminophen (PERCOCET/ROXICET) 5-325 MG per tablet 1 tablet (1 tablet Oral Given 02/23/22 0727)  ketorolac (TORADOL) 30 MG/ML injection 15 mg (15 mg Intravenous Given 02/23/22 0818)     IMPRESSION / MDM / ASSESSMENT AND PLAN / ED COURSE  I reviewed the triage vital signs and the nursing notes.                              Patient's presentation is most consistent with acute presentation with potential threat to life or bodily function.  Differential diagnosis includes, but is not limited to, acute coronary syndrome, pneumothorax, pulmonary embolism, pericarditis/myocarditis, musculoskeletal pain, GERD/GI related pain  Patient is a 27 year old male is otherwise healthy does use tobacco presents with chest pain x 1 hour.  Woke him up from sleep described as tightness radiating to the back  associated with nausea diaphoresis and dyspnea.  Initially was constant and has been coming and going.  It is both worse with movement worse with walking and worse with taking deep breath.  Somewhat reproducible on palpation of chest wall as well.  On arrival patient is hypertensive slightly tachypneic.  Does look somewhat uncomfortable is diaphoretic.  EKG does not show obvious signs of STEMI.  Differential is as above.  Chest x-ray without acute cardiopulmonary process.  Plan to treat as both musculoskeletal and GI related pain with GI cocktail and Percocet.  Will obtain cardiac enzymes.  Patient has minimal risk factors for ACS and he is young but pain is somewhat concerning and he does look uncomfortable so we will need to screen for ACS.  Will likely need 2 sets of troponins given the onset of pain.  PE but he is PERC negative and low risk for DVT/PE and he is not hypoxic or tachycardic feel this is less likely.  For set of troponins negative.  Will repeat at 2 hours.  Patient feeling improved after Percocet and GI cocktail but does still have some residual pain.  Will give IM Toradol.  Reviewed the monitor patient satting 93%.  This could be related to splinting but given his relative hypoxia have added on a D-dimer.  Repeat troponin is negative as is the D-dimer.  Patient feeling improved after medications.  My suspicion for serious underlying cause of chest pain is low at this time given reassuring workup and lack of risk factors.  Suspect this is either musculoskeletal pain versus costochondritis.  Discussed Tylenol and ibuprofen for supportive care and follow-up with PCP if symptoms or not improving. FINAL CLINICAL IMPRESSION(S) / ED DIAGNOSES   Final diagnoses:  Chest pain, unspecified type     Rx / DC Orders   ED Discharge Orders     None        Note:  This document was prepared using Dragon voice recognition software and may include unintentional dictation errors.   Rada Hay, MD 02/23/22 1009

## 2022-02-23 NOTE — Discharge Instructions (Signed)
Your blood work including screen for heart attack and blood clots was all reassuring.  Please take 400 mg of ibuprofen every 6-8 hours for pain.  If your pain is not improving please follow-up with your primary doctor.  If anything is worsening please return to the ER.

## 2022-02-25 ENCOUNTER — Other Ambulatory Visit: Payer: Self-pay

## 2022-02-25 MED ORDER — AMOXICILLIN 500 MG PO CAPS
500.0000 mg | ORAL_CAPSULE | Freq: Three times a day (TID) | ORAL | 0 refills | Status: AC
  Filled 2022-02-25: qty 21, 7d supply, fill #0

## 2022-02-27 ENCOUNTER — Ambulatory Visit
Admission: EM | Admit: 2022-02-27 | Discharge: 2022-02-27 | Disposition: A | Discharge status: Home / Self Care | Payer: Managed Care, Other (non HMO) | Attending: Family Medicine | Admitting: Family Medicine

## 2022-02-27 DIAGNOSIS — K0889 Other specified disorders of teeth and supporting structures: Secondary | ICD-10-CM

## 2022-02-27 MED ORDER — TRAMADOL HCL 50 MG PO TABS: 50.0000 mg | ORAL_TABLET | Freq: Two times a day (BID) | ORAL | 0 refills | Status: AC | PRN

## 2022-02-27 NOTE — ED Triage Notes (Signed)
Pt c/o upper RT sided dental pain, pt states he is having tooth pulled Tuesday

## 2022-02-27 NOTE — Discharge Instructions (Addendum)
Stop by the pharmacy to pick up your prescriptions.  Follow up with your dentist and take your antibiotics as previously prescribed by your previous provider.

## 2022-02-27 NOTE — ED Provider Notes (Signed)
[facility]    CSN: [id number] Arrival date & time: 02/27/22  1857      History   Chief Complaint Chief Complaint  Patient presents with   Dental Pain    HPI Adam Cantu is a 27 y.o. male.   HPI   Adam Cantu presents for right upper dental pain that has been ongoing despite use of over-the-counter medications.  Reports he is having the tooth pulled on Tuesday.  Says that he is taking antibiotics but he cannot recall which one and at what dosage.      Past Medical History:  Diagnosis Date   ADHD     There are no problems to display for this patient.   Past Surgical History:  Procedure Laterality Date   NO PAST SURGERIES         Home Medications    Prior to Admission medications   Medication Sig Start Date End Date Taking? Authorizing Provider  traMADol (ULTRAM) 50 MG tablet Take 1 tablet (50 mg total) by mouth every 12 (twelve) hours as needed. 02/27/22  Yes Adam Livesay, DO  amoxicillin (AMOXIL) 500 MG capsule Take 1 capsule (500 mg total) by mouth every 8 (eight) hours until gone. 02/12/22     lidocaine (LIDODERM) 5 % Place 1 patch onto the skin daily. Remove & Discard patch within 12 hours or as directed by MD 06/24/21   Adam Sams, PA-C  meloxicam (MOBIC) 7.5 MG tablet Take 1 tablet (7.5 mg total) by mouth daily. Start with once daily with a meal. Can increase to twice daily (every 12 hours) if needed 06/24/21   Adam Sams, PA-C  tiZANidine (ZANAFLEX) 4 MG tablet Take 1 tablet (4 mg total) by mouth every 8 (eight) hours as needed for muscle spasms. 06/24/21   Adam Sams, PA-C    Family History Family History  Problem Relation Age of Onset   Anxiety disorder Mother    Asthma Mother    Obesity Father    Obesity Sister     Social History Social History   Tobacco Use   Smoking status: Some Days    Packs/day: 0.10    Types: Cigarettes    Start date: 03/22/2017   Smokeless tobacco: Never  Vaping Use   Vaping Use: Some  days   Substances: Nicotine  Substance Use Topics   Alcohol use: Yes    Alcohol/week: 28.0 standard drinks of alcohol    Types: 24 Cans of beer, 4 Shots of liquor per week   Drug use: Yes    Types: Marijuana     Allergies   Patient has no known allergies.   Review of Systems Review of Systems: negative unless otherwise stated in HPI.      Physical Exam Triage Vital Signs ED Triage Vitals  Enc Vitals Group     BP 02/27/22 1944 (!) 150/90     Pulse Rate 02/27/22 1942 76     Resp --      Temp 02/27/22 1942 98.2 F (36.8 C)     Temp Source 02/27/22 1942 Oral     SpO2 02/27/22 1942 98 %     Weight 02/27/22 1941 230 lb (104.3 kg)     Height 02/27/22 1941 6' (1.829 m)     Head Circumference --      Peak Flow --      Pain Score 02/27/22 1941 10     Pain Loc --      Pain Edu? --  Excl. in [place]? --    No data found.  Updated Vital Signs BP (!) 150/90   Pulse 76   Temp 98.2 F (36.8 C) (Oral)   Ht 6' (1.829 m)   Wt 104.3 kg   SpO2 98%   BMI 31.19 kg/m   Visual Acuity Right Eye Distance:   Left Eye Distance:   Bilateral Distance:    Right Eye Near:   Left Eye Near:    Bilateral Near:     Physical Exam  GEN:     alert, uncomfortable appearing male    HENT:  mucus membranes moist, oropharyngeal without lesions exudates or erythema, nasal discharge, no trismus, no secretion pooling, no palpable induration and no visible swelling of the floor the mouth, no cheek edema or erythema; normal jaw movement without difficulty EYES:   pupils equal and reactive, no scleral injection or discharge NECK:  normal ROM, no lymphadenopathy, no meningismus   RESP:  no increased work of breathing CVS:   regular rate  Skin:   warm and dry, no rash or skin changes of on external jaw     UC Treatments / Results  Labs (all labs ordered are listed, but only abnormal results are displayed) Labs Reviewed - No data to display  EKG   Radiology No results  found.  Procedures Procedures (including critical care time)  Medications Ordered in UC Medications - No data to display  Initial Impression / Assessment and Plan / UC Course  I have reviewed the triage vital signs and the nursing notes.  Pertinent labs & imaging results that were available during my care of the patient were reviewed by me and considered in my medical decision making (see chart for details).       Pt is a 27 y.o. male who presents for dental pain. Dameron is afebrile here without recent antipyretics. Satting well on room air. He is tachycardic.  Overall pt is uncomfortable appearing, well hydrated, without respiratory distress.  He reports taking antibiotics prescribed by an outside provider.  - continue Tylenol with Motrin as needed for discomfort - Tramadol prescribed for additional pain pain - Gargle with salt water several times a day - Handout provided  - Follow up with dentist for dental extraction  - Discussed  ED precautions, understanding voiced.   Discussed MDM, treatment plan and plan for follow-up with patient who agrees with plan.      Final Clinical Impressions(s) / UC Diagnoses   Final diagnoses:  Pain, dental     Discharge Instructions      Stop by the pharmacy to pick up your prescriptions.  Follow up with your dentist and take your antibiotics as previously prescribed by your previous provider.      ED Prescriptions     Medication Sig Dispense Auth. Provider   traMADol (ULTRAM) 50 MG tablet Take 1 tablet (50 mg total) by mouth every 12 (twelve) hours as needed. 6 tablet Adam Cantu, Ronnette Juniper, DO      I have reviewed the PDMP during this encounter.   Adam Hensen, DO 02/27/22 2037

## 2023-02-14 IMAGING — CR DG LUMBAR SPINE 2-3V
3 series · 3 of 3 positions shown · non-contrast
Comparison: None Available.

CLINICAL DATA: Lifting injury 1 hour ago with midline spinous
tenderness.

EXAM:
LUMBAR SPINE - 2-3 VIEW

[l-spine ap]
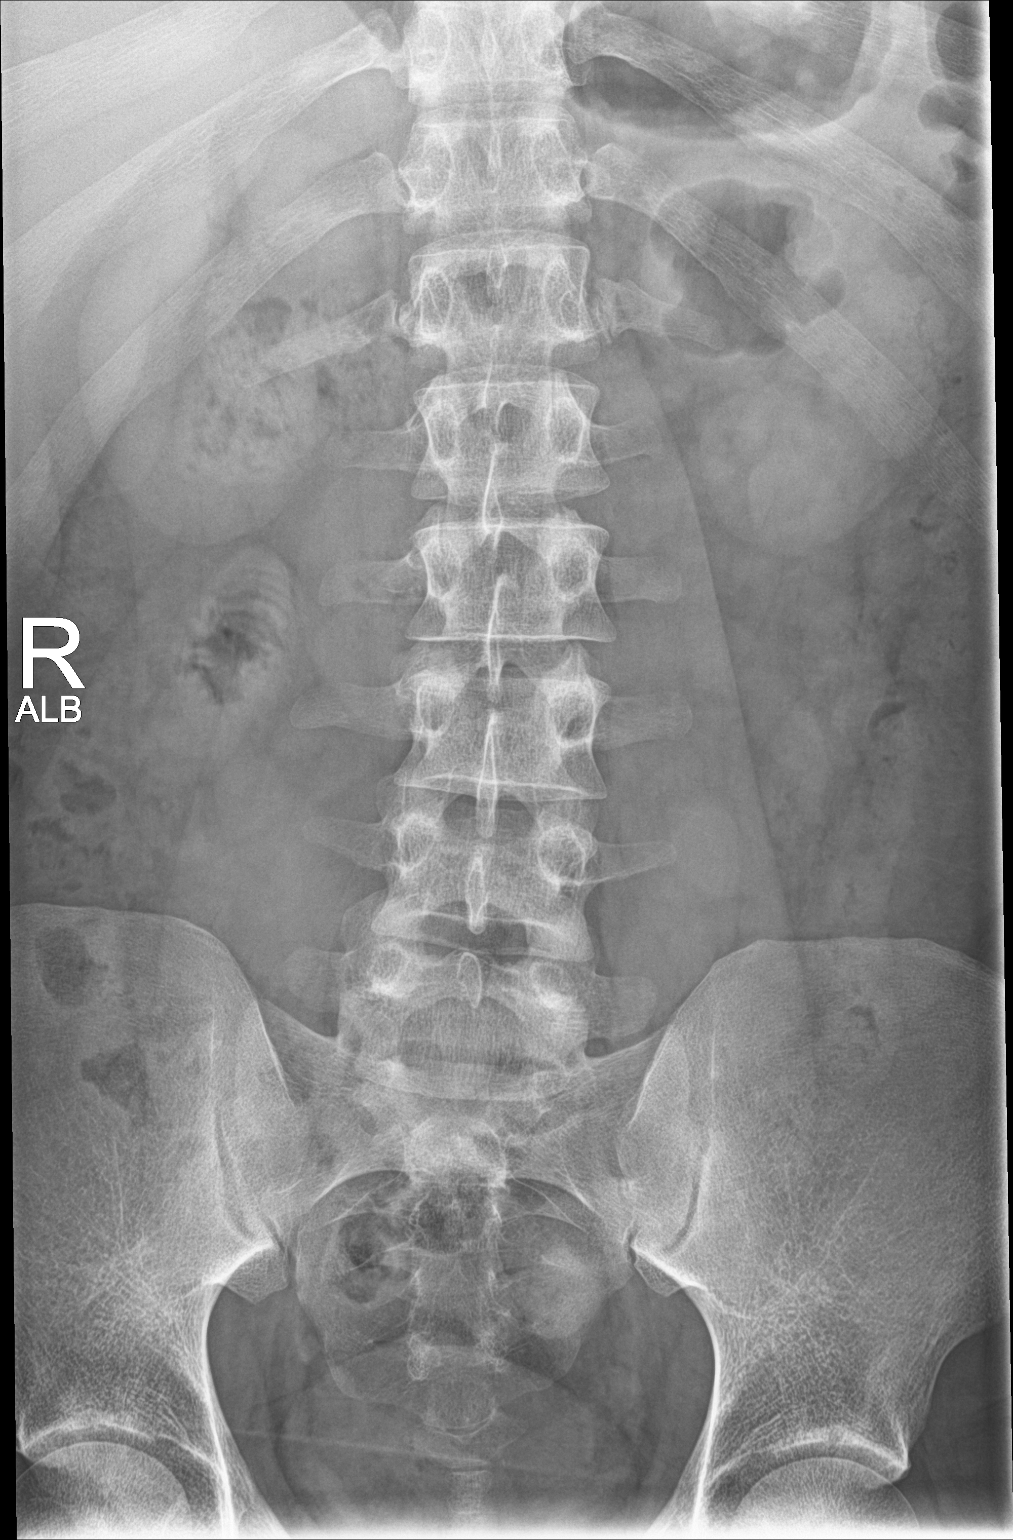

[l-spine lat]
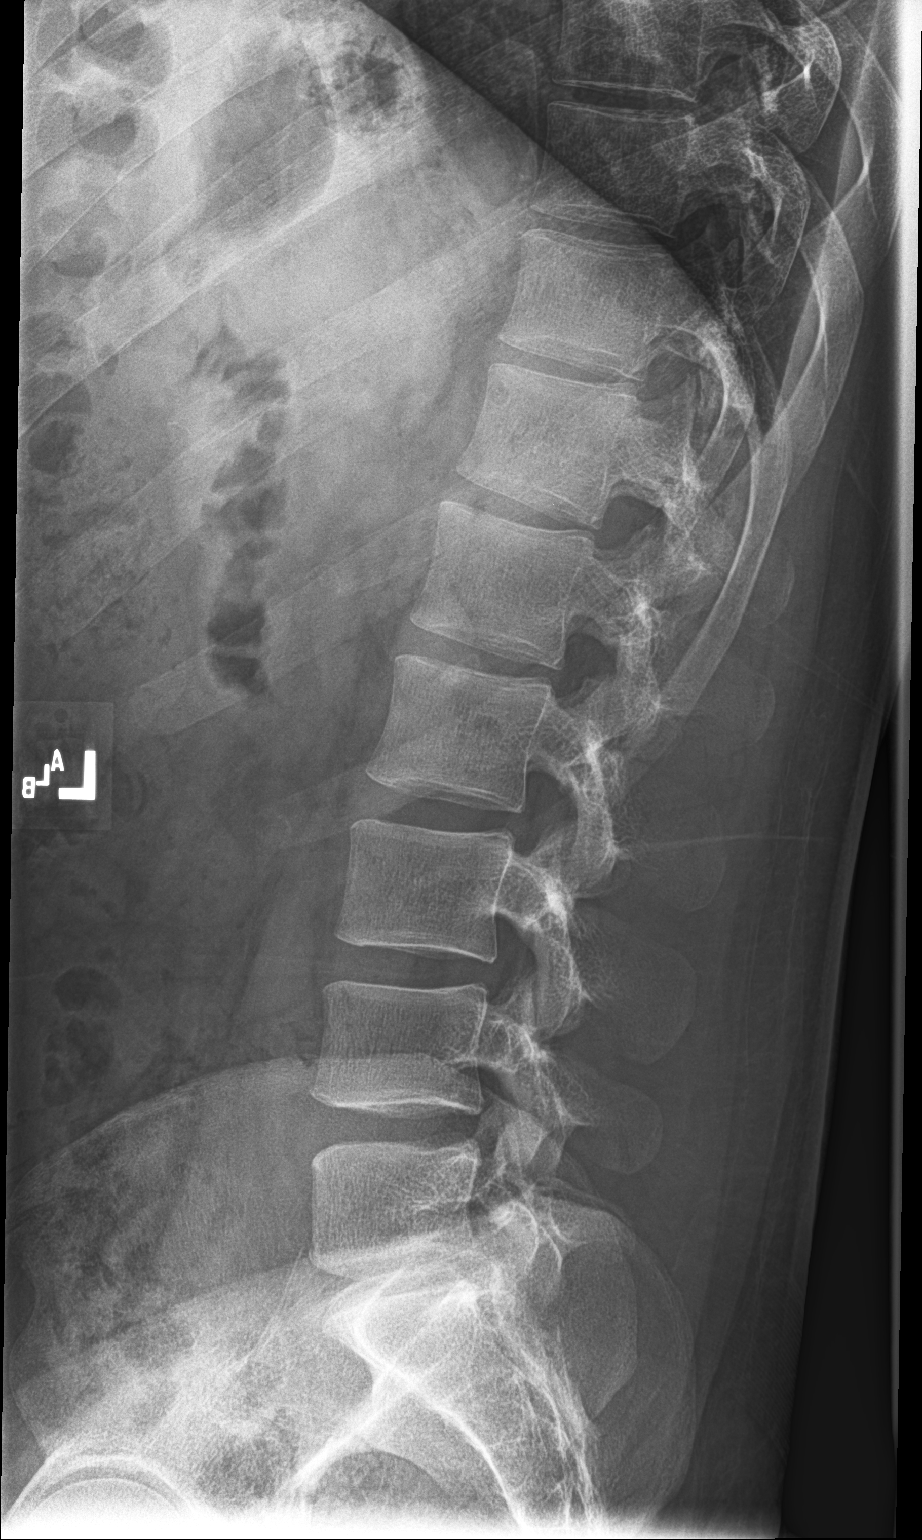

[l-spine spot]
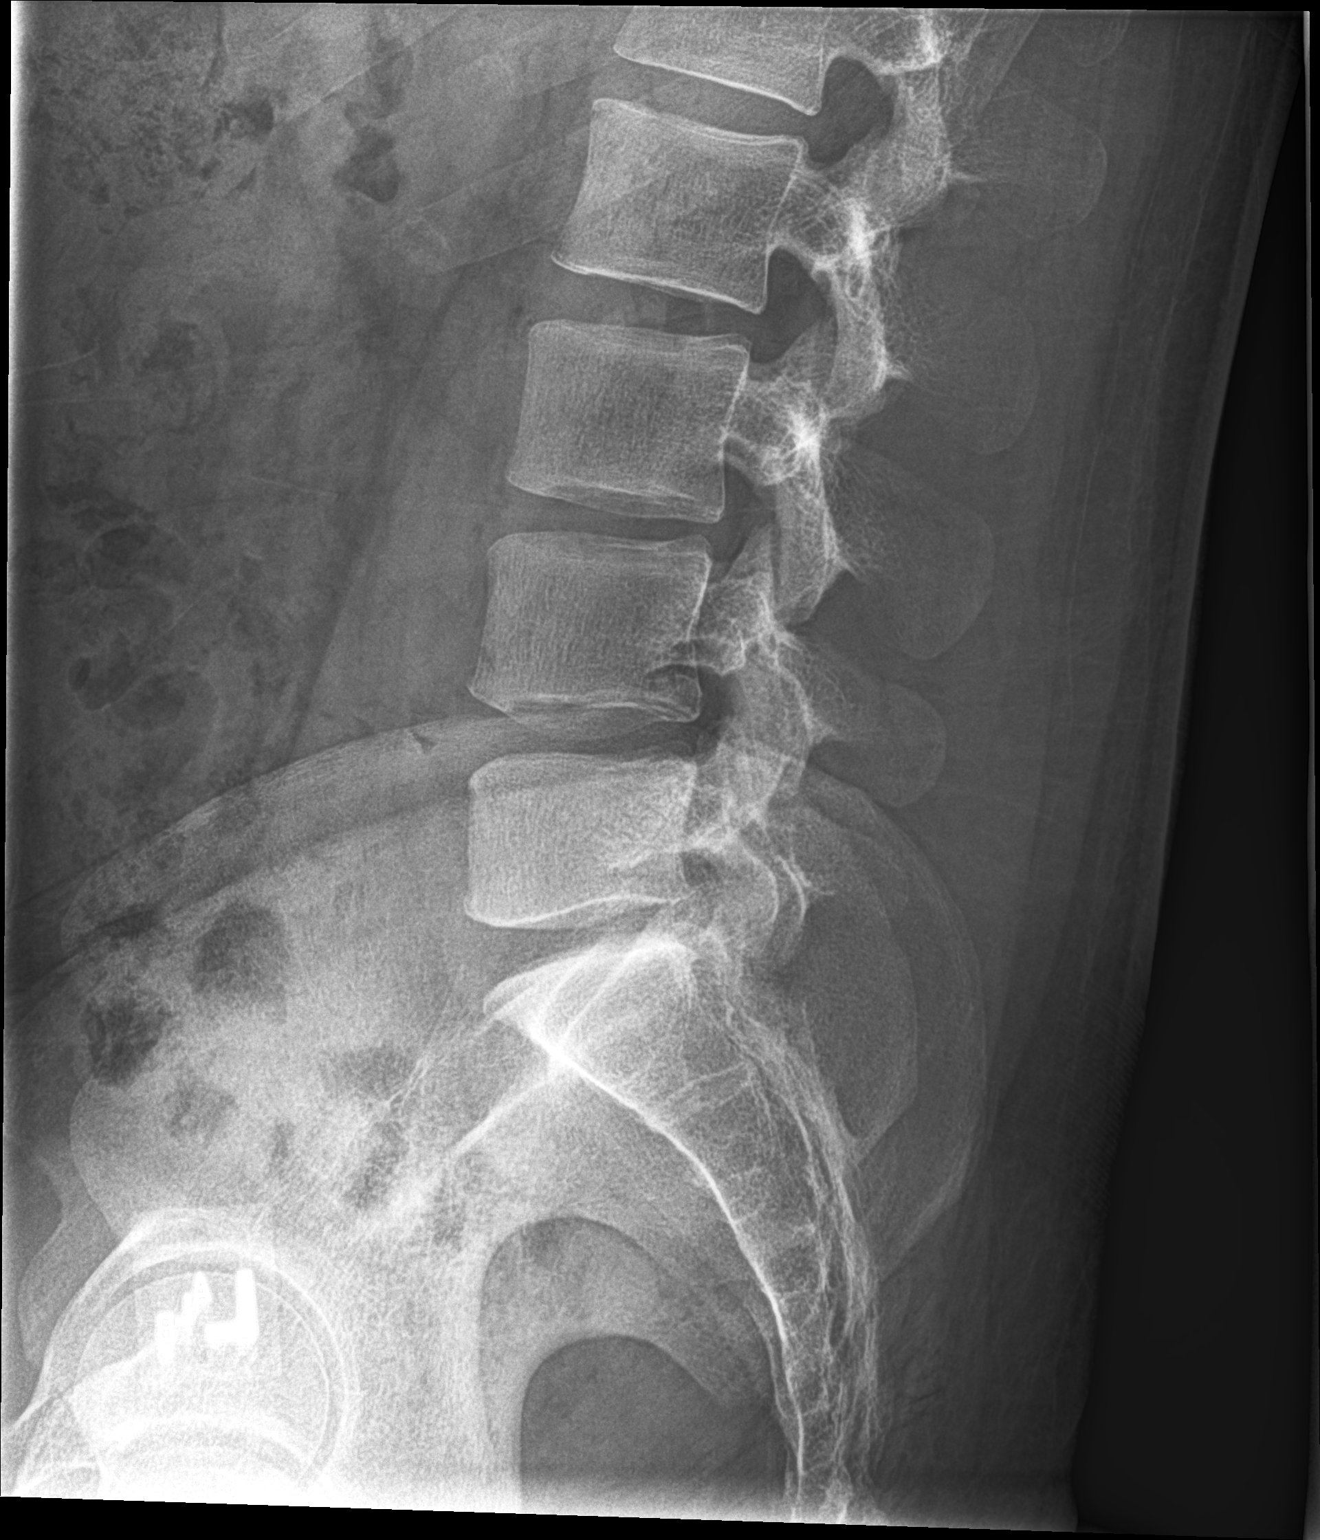

[3 of 3 positions shown; findings below may reference images not displayed]

FINDINGS: There are 5 lumbar type vertebral bodies. The alignment is normal.
The disc spaces are preserved. No evidence of acute fracture or pars
defect. The surrounding soft tissues appear unremarkable.
IMPRESSION: Normal lumbar spine radiographs.
